# Patient Record
Sex: Female | Born: 1980 | Hispanic: No | Marital: Married | Smoking: Never smoker
Health system: Southern US, Community
[De-identification: ages and names within clinical notes are randomized; demographics above are authoritative.]

## PROBLEM LIST (undated history)

## (undated) DIAGNOSIS — S8290XA Unspecified fracture of unspecified lower leg, initial encounter for closed fracture: Secondary | ICD-10-CM

## (undated) DIAGNOSIS — D689 Coagulation defect, unspecified: Secondary | ICD-10-CM

## (undated) DIAGNOSIS — M21612 Bunion of left foot: Secondary | ICD-10-CM

## (undated) DIAGNOSIS — G4700 Insomnia, unspecified: Secondary | ICD-10-CM

## (undated) DIAGNOSIS — J40 Bronchitis, not specified as acute or chronic: Secondary | ICD-10-CM

## (undated) DIAGNOSIS — M792 Neuralgia and neuritis, unspecified: Secondary | ICD-10-CM

## (undated) DIAGNOSIS — M21611 Bunion of right foot: Secondary | ICD-10-CM

## (undated) DIAGNOSIS — K602 Anal fissure, unspecified: Secondary | ICD-10-CM

## (undated) DIAGNOSIS — F419 Anxiety disorder, unspecified: Secondary | ICD-10-CM

## (undated) HISTORY — DX: Unspecified fracture of unspecified lower leg, initial encounter for closed fracture: S82.90XA

## (undated) HISTORY — PX: LEG SURGERY: SHX1003

## (undated) HISTORY — PX: WISDOM TOOTH EXTRACTION: SHX21

## (undated) HISTORY — DX: Neuralgia and neuritis, unspecified: M79.2

## (undated) HISTORY — DX: Coagulation defect, unspecified: D68.9

---

## 2006-03-17 ENCOUNTER — Other Ambulatory Visit: Admission: RE | Admit: 2006-03-17 | Discharge: 2006-03-17 | Payer: Self-pay | Admitting: Obstetrics and Gynecology

## 2007-03-19 ENCOUNTER — Other Ambulatory Visit: Admission: RE | Admit: 2007-03-19 | Discharge: 2007-03-19 | Payer: Self-pay | Admitting: Obstetrics and Gynecology

## 2008-03-28 ENCOUNTER — Other Ambulatory Visit: Admission: RE | Admit: 2008-03-28 | Discharge: 2008-03-28 | Payer: Self-pay | Admitting: Obstetrics and Gynecology

## 2008-08-15 ENCOUNTER — Encounter: Admission: RE | Admit: 2008-08-15 | Discharge: 2008-08-15 | Payer: Self-pay | Admitting: Family Medicine

## 2009-06-04 ENCOUNTER — Emergency Department (HOSPITAL_COMMUNITY): Admission: EM | Admit: 2009-06-04 | Discharge: 2009-06-04 | Payer: Self-pay | Admitting: Family Medicine

## 2009-12-17 ENCOUNTER — Other Ambulatory Visit: Admission: RE | Admit: 2009-12-17 | Discharge: 2009-12-17 | Payer: Self-pay | Admitting: Obstetrics and Gynecology

## 2011-12-26 ENCOUNTER — Other Ambulatory Visit (HOSPITAL_COMMUNITY)
Admission: RE | Admit: 2011-12-26 | Discharge: 2011-12-26 | Disposition: A | Discharge status: Home / Self Care | Payer: BC Managed Care – PPO | Source: Ambulatory Visit | Attending: Obstetrics and Gynecology | Admitting: Obstetrics and Gynecology

## 2011-12-26 DIAGNOSIS — Z01419 Encounter for gynecological examination (general) (routine) without abnormal findings: Secondary | ICD-10-CM | POA: Insufficient documentation

## 2012-08-27 ENCOUNTER — Encounter (INDEPENDENT_AMBULATORY_CARE_PROVIDER_SITE_OTHER): Payer: Self-pay | Admitting: Surgery

## 2012-08-27 ENCOUNTER — Ambulatory Visit (INDEPENDENT_AMBULATORY_CARE_PROVIDER_SITE_OTHER): Payer: BC Managed Care – PPO | Admitting: Surgery

## 2012-08-27 VITALS — BP 150/84 | HR 76 | Resp 14 | Ht 67.0 in | Wt 244.8 lb

## 2012-08-27 DIAGNOSIS — K649 Unspecified hemorrhoids: Secondary | ICD-10-CM | POA: Insufficient documentation

## 2012-08-27 NOTE — Progress Notes (Addendum)
Re:   Samantha [name] DOB:   [birth year] MRN:   [id number]  ASSESSMENT AND PLAN: 1.  Anterior anal fissure  I gave her literature on fissures and rectal disease.  I discussed medical and surgical care of the fissure. I think that she will do well with medical care.  I discussed the internal sphincterotomy.  I also told her pregnancy could make her bowels and rectum worse.  I gave her a prescription for Anusol HC suppositories (#28, 2 refills) and Diltiazem cream (2 refills).  Her return appointment is PRN.  2.  Two external hemorrhoids  These are no causing her symptoms. 3.  Anxiety.  Chief Complaint  Patient presents with  . New Evaluation    hems   REFERRING PHYSICIAN: Bradd Burner, PA-C  HISTORY OF PRESENT ILLNESS: Samantha [name] is a 32 y.o. (DOB: 12-13-1980)  white  female whose primary care physician is Bradd Burner, PA-C and comes to me today for rectal pain that she thinks is secondary to hemorrhoids.  The patient gives a fairly classic story for a rectal fissure.  She has been having rectal pain with bowel movements for about 2 years on and off.  She does not remember a precipitating event.  She said it was much worse about 2 weeks ago when she made the appointment.  It seems to get better and then get worse.   She has used sitz baths, which helps.  She tried steroid suppositories, which helped.  She has no personal history of colon or rectal problems.  She has no family history of colon cancer, Crohn's disease, or colitis.  She has a soft bowel movement every other day  She does take stool bulking agents.  And she does notice some blood on her toilet paper, but not in her stool.   Past Medical History  Diagnosis Date  . Leg fracture      No past surgical history on file.    Current Outpatient Prescriptions  Medication Sig Dispense Refill  . calcium-vitamin D (OSCAL WITH D) 500-200 MG-UNIT per tablet Take 1 tablet by mouth.        . folic acid (FOLVITE) 1 MG tablet Take 1 mg by mouth daily.      Marland Kitchen LEXAPRO 20 MG tablet       . Multiple Vitamins-Minerals (MULTIVITAMIN WITH MINERALS) tablet Take 1 tablet by mouth daily.      . polycarbophil (FIBERCON) 625 MG tablet Take 625 mg by mouth daily.      Marland Kitchen zolpidem (AMBIEN) 10 MG tablet        No current facility-administered medications for this visit.     No Known Allergies  REVIEW OF SYSTEMS: Skin:  No history of rash.  No history of abnormal moles. Infection:  No history of hepatitis or HIV.  No history of MRSA. Neurologic:  No history of stroke.  No history of seizure.  No history of headaches. Cardiac:  No history of hypertension. No history of heart disease.  No history of prior cardiac catheterization. Pulmonary:  Does not smoke cigarettes.  No asthma or bronchitis.  No OSA/CPAP.  Endocrine:  No diabetes. No thyroid disease. Gastrointestinal:  No history of stomach disease.  No history of liver disease.  No history of gall bladder disease.  No history of pancreas disease.  No history of colon disease. Urologic:  No history of kidney stones.  No history of bladder infections. GYN:  Thinking about getting pregnant. Musculoskeletal:  No history of joint or back disease. Hematologic:  No bleeding disorder.  No history of anemia.  Not anticoagulated. Psycho-social:  The patient is oriented.   The patient has no obvious psychologic or social impairment to understanding our conversation and plan.  History of anxiety.  SOCIAL and FAMILY HISTORY: Married.  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  Works on [employer]. No children  PHYSICAL EXAM: BP 150/84  Pulse 76  Resp 14  Ht 5\' 7"  (1.702 m)  Wt 244 lb 12.8 oz (111.041 kg)  BMI 38.33 kg/m2  General: Obese WN WF who is alert and generally healthy appearing.  HEENT: Normal. Pupils equal. Neck: Supple. No mass.  No thyroid mass. Lymph Nodes:  No supraclavicular or cervical nodes. Lungs: Clear to auscultation and  symmetric breath sounds. Heart:  RRR. No murmur or rub. Abdomen: Soft. No mass. No tenderness. No hernia. Normal bowel sounds.  No abdominal scars. Rectal: Has left posterior and anterior hemorrhoidal tag.  These are not inflamed or thrombosed.  She does have an anterior rectal fissure with a sentinel tag.  But she tolerated rectal exam well and exam with the lighter anoscope. Extremities:  Good strength and ROM  in upper and lower extremities. Neurologic:  Grossly intact to motor and sensory function. Psychiatric: Has normal mood and affect. Behavior is normal.   DATA REVIEWED: No info.  Ovidio Kin, MD,  [facility] [address] [place]  [address]   [place], [place]    [address] Phone:  [phone] FAX:  [phone]

## 2012-12-27 ENCOUNTER — Other Ambulatory Visit: Payer: Self-pay | Admitting: Obstetrics and Gynecology

## 2012-12-27 ENCOUNTER — Other Ambulatory Visit (HOSPITAL_COMMUNITY)
Admission: RE | Admit: 2012-12-27 | Discharge: 2012-12-27 | Disposition: A | Discharge status: Home / Self Care | Payer: BC Managed Care – PPO | Source: Ambulatory Visit | Attending: Obstetrics and Gynecology | Admitting: Obstetrics and Gynecology

## 2012-12-27 DIAGNOSIS — Z01419 Encounter for gynecological examination (general) (routine) without abnormal findings: Secondary | ICD-10-CM | POA: Insufficient documentation

## 2012-12-27 DIAGNOSIS — Z1151 Encounter for screening for human papillomavirus (HPV): Secondary | ICD-10-CM | POA: Insufficient documentation

## 2014-01-16 ENCOUNTER — Other Ambulatory Visit: Payer: Self-pay | Admitting: Obstetrics and Gynecology

## 2014-01-16 ENCOUNTER — Other Ambulatory Visit (HOSPITAL_COMMUNITY)
Admission: RE | Admit: 2014-01-16 | Discharge: 2014-01-16 | Disposition: A | Discharge status: Home / Self Care | Payer: BC Managed Care – PPO | Source: Ambulatory Visit | Attending: Obstetrics and Gynecology | Admitting: Obstetrics and Gynecology

## 2014-01-16 DIAGNOSIS — Z01419 Encounter for gynecological examination (general) (routine) without abnormal findings: Secondary | ICD-10-CM | POA: Insufficient documentation

## 2014-01-17 LAB — CYTOLOGY - PAP

## 2015-01-16 ENCOUNTER — Other Ambulatory Visit: Payer: Self-pay | Admitting: Obstetrics and Gynecology

## 2015-01-16 ENCOUNTER — Other Ambulatory Visit (HOSPITAL_COMMUNITY)
Admission: RE | Admit: 2015-01-16 | Discharge: 2015-01-16 | Disposition: A | Discharge status: Home / Self Care | Payer: Commercial Managed Care - HMO | Source: Ambulatory Visit | Attending: Obstetrics and Gynecology | Admitting: Obstetrics and Gynecology

## 2015-01-16 DIAGNOSIS — Z01419 Encounter for gynecological examination (general) (routine) without abnormal findings: Secondary | ICD-10-CM | POA: Insufficient documentation

## 2015-01-17 LAB — CYTOLOGY - PAP

## 2015-10-15 ENCOUNTER — Encounter: Payer: Self-pay | Admitting: Family Medicine

## 2015-10-15 ENCOUNTER — Ambulatory Visit (INDEPENDENT_AMBULATORY_CARE_PROVIDER_SITE_OTHER): Payer: Commercial Managed Care - HMO | Admitting: Family Medicine

## 2015-10-15 DIAGNOSIS — F5081 Binge eating disorder: Secondary | ICD-10-CM | POA: Diagnosis not present

## 2015-10-15 NOTE — Patient Instructions (Addendum)
Sleep: - Consider seeing acupuncturist [name]) for your sleep problems.   - Read the sleep handout provided today.    Weight Management: - You will be better off with 1600 kcal per day than the meager 1200 kcal, especially when you work out.   - Eat at least 3 REAL meals and 1-2 snacks per day.  Aim for no more than 5 hours between eating.  Eat breakfast within one hour of getting up.   A real meal includes at least a protein, a starch, and veg's and/or fruit.    OR: Would you serve this to a guest in your home, and call it a meal? - Obtain twice as many veg's as protein or carbohydrate foods for both lunch and dinner.  Dealing with non-hunger eating:  - Delay:  Neither urges nor emotions last forever - Distract:  Engaging in another activity can truly get your mind off of food  - Distance:  Move to another room (away from the food), go for a walk, or change your environment AND: - Determine:  What is happening here, and what are my options?  - When you are struggling, ask yourself these three "decoding" questions:  1. What am I feeling right now?  2. What do I want to feel?  3. What do I truly need right now? Use the Feelings and Needs lists, and WRITE your answers.  After each question, ask yourself, "Is there anything more?" You are looking for feelings, not thoughts.  Bring your responses to your follow-up appt.   - Decide:  What will be your action plan? - Be Deliberate in using the tools you learn!  - Pay attention to your hunger.  If you feel hungry, DO get something to eat!  Consider Weigh to Wellness II, starting Oct 10; meets 6 Tuesdays from 5:30-6:30 PM at [facility], [address]   Weigh to [employer] Online Registration, 2017:  1. Registration can be accessed at the following:   [email or web address]  2. Click on Weight Loss.   3. At the next screen, click on Weigh to Wellness I: The Fundamentals or for Weigh to Wellness II:  Beyond Diets.    4. Now click on REGISTER ONLINE, and follow the instructions for the information and payment requested.  Be sure to indicate if you are a [facility] employee (and provide employee ID if you are).    5. You'll need to click on ADD TO CART followed by PLACE ORDER.

## 2015-10-15 NOTE — Progress Notes (Signed)
Medical Nutrition Therapy:  Appt start time: 1000 end time:  1100. PCP: Elias Elseobert Reade, MD; [facility] (Fax visit notes to Dr. Nicholos Johnseade - [phone])  Ms. Carmelina DaneKillian was referred by Dr. Nicholos Johnseade to management of her weight (E66.01) and binge eating d/o (F50.81) Assessment:  Primary concerns today: Weight management and binge eating disorder (F50.81).   Marchelle Folksmanda really wants to get to a normal, healthy weight.  She is following [employer] weight loss program offered through her work.  In the program, she has specific goals, and an app that gives her feedback, including recent advice to see an RD b/c her kcal intake is so low.  She has been losing 1-2 lb per week.    Marchelle Folksmanda has realized with the program she's following that she often eats emotionally and with boredom.    Learning Readiness: Change in progress; has been on a 1200-1600-kcal diet for ~3 months, with one day/wk of higher kcal, up to 2200 kcal.    Usual eating pattern includes 3 meals and 1 snack per day. Frequent foods and beverages include [place] yogurt, almonds, Grapenuts, coffee with sk milk, apples, bananas, str'berries, grapes, Malaw[place]urkey, chx, salmon, soy burgers, garbanzos, small wraps, avocados, carrots, cucumbers, spinach, goat chs, hummus, frozen burritos.  Avoided foods include most other fruits (dislikes tartness).   Usual physical activity includes 1 hr str training & cardio with a personal trainer; 1 hr weights & cardio 1 X wk water ex 1-2 X wk.  24-hr recall: (Up at 9:30 AM) B (11 AM)-   2 slc avocado toast, 2 eggs, coffee, 2 oz sk milk Snk (2 PM)-   1 apple, 2 T pb L (4 PM)-  1 large salad, bacon bits, sesame sds, 1 T lite ranch, water Snk ( PM)-  --- D (7:30 PM)-  [place] beef & veg's, 1/2 c brn rice, water Snk ( PM)-  --- Typical day? Yes.  for a weekend, although seldom eats out.    Progress Towards Goal(s):  In progress.   Nutritional Diagnosis:  [place]-3.3 Overweight/obesity As related to energy imbalance.  As  evidenced by BMI >38.    Intervention:  Nutrition education.  Handouts given during visit include:  AVS  Sleep hygiene handout  Decoding Feelings and Needs lists  Demonstrated degree of understanding via:  Teach Back  Barriers to learning/adherence to lifestyle change: Unlikely sustainability of 1200-kcal diet.    Monitoring/Evaluation:  Dietary intake, exercise, and body weight in 8 week(s).

## 2015-12-06 ENCOUNTER — Encounter: Payer: Self-pay | Admitting: Family Medicine

## 2015-12-06 ENCOUNTER — Ambulatory Visit (INDEPENDENT_AMBULATORY_CARE_PROVIDER_SITE_OTHER): Payer: Commercial Managed Care - HMO | Admitting: Family Medicine

## 2015-12-06 DIAGNOSIS — F5081 Binge eating disorder: Secondary | ICD-10-CM

## 2015-12-06 NOTE — Patient Instructions (Addendum)
-   Keep up the great choices! - Your [employer] program recommendations for 1400 kcal: 42 g fat, 74 g protein, 182 g carb.  These are roughly equivalent to 30% fat, 20% protein, and 50% carb.  This is very reasonable, and should help you continue to reach your goals.    - Talk with Dr. Nicholos Johnseade about increasing your exercise.  (My recommendation:  Hold off on increasing until below 200 lb.)  - Keep accessible your materials on emotional eating; use as needed.    - A tentative start date for the Weigh to Wellness II class is Monday, Feb 26.  We [facility] 5:30-6:30 [facility].  I will confirm class dates with you at your appt in Dec.

## 2015-12-06 NOTE — Progress Notes (Signed)
Medical Nutrition Therapy:  Appt start time: 1000 end time:  1100. PCP: Elias Elseobert Reade, MD; [facility] (Fax visit notes to Dr. Nicholos Johnseade - [phone])  Ms. Samantha [name] was referred by Dr. Nicholos Johnseade to management of her weight (E66.01) and binge eating d/o (F50.81) Assessment:  Primary concerns today: Weight management and binge eating disorder (F50.81).   Samantha [name]'s weight is down 10 lb since last appt on 10/15/15, and a total of 48 lb since first starting.  For fun last night, she brought a knapsack filled with 50 lb to the gym, where she walked a mile with it.  After taking it off, she went another mile, which felt great.  She has noticed many benefits, like fitting into chairs well, not getting winded walking up stairs.  She has found some new recipes she likes, such as baked (no frying) eggplant parmigiana. Samantha [name] is still doing an eat-what-you-want day each week, but has found that she is making increasingly better choices on those days and enjoying it.    Physical activity includes personal trainer 1 X wk (mostly strength training), body pump 1 X wk, 60 min walking 1 X wk, and occasionally another workout per week.  She is also making an effort to use the stairs vs. elevator, etc.    24-hr recall:  (Up at 7:30 AM) B (7:40 AM)-  1/2 c Grapenuts, 1/2 c skim milk, water Snk (9 AM)-  20 oz coffee, 1/4 c sk milk L (12 PM)-  1 bean burrito, water Snk ( PM)-  water D (5:30 PM)-  1 c Chinese mandarin seitan, 1 c brn rice, 1/2 c broccoli, water Snk ( PM)-  --- Typical day? Yes.  but did not have usual veg's at lunch or dinner, and skipped afternoon snack b/c she was too busy.    Progress Towards Goal(s):  In progress.   Nutritional Diagnosis:  [place]-3.3 Overweight/obesity As related to energy imbalance.  As evidenced by BMI >38.    Intervention:  Nutrition education.  Handouts given during visit include:  AVS  Demonstrated degree of understanding via:  Teach Back  Barriers to  learning/adherence to lifestyle change: No signficant barriers foreseen right now; pt is doing great, and is aware that stress or other life events can sometimes be challenging.  Seems to have gained a lot of insight into how to handle difficulties around food and exercise behaviors.    Monitoring/Evaluation:  Dietary intake, exercise, and body weight in 8 week(s).

## 2016-01-01 ENCOUNTER — Ambulatory Visit: Payer: Self-pay | Admitting: Surgery

## 2016-01-01 NOTE — H&P (Signed)
Samantha [name] 01/01/2016 11:26 AM Location: [facility] Patient #: [id number] DOB: July 23, 1980 Married / Language: [place] / Race: White Female   History of Present Illness Samantha [name]([name] C. [name] MD; 01/01/2016 12:20 PM) The patient is a 35 year old female who presents with anal pain. Note for "Anal pain": Patient here for surgical consultation at the request of Dr. Jimmy FootmanBecker [employer] positions.. Has been struggling with anal pain for several years.  Pleasant woman. Developed sharp rectal pain a few years ago. Was seen by Dr. Ezzard StandingNewman in our group. Diagnosed with an anterior anal fissure, though she couldn't tolerate digital and anoscopic exam.. Recommendation made for suppositories as well as diltiazem cream. Follow-up if not improved. I think she was thrown off by the idea of possibly needing a sphincterotomy if that failed. She's been trying to deal with it on her own for the past few years. Discussing with her primary care physician. She's been taking a fiber supplement. Sounds like a FiberCon tablet once a day. Moves her bowels about every other day.   She notes that the rectal bleeding and pain has never fully gone away. Intermittently flares. Became more intense this summer. She feels like she has 2 spots that bother her now. She had 2 skin tags 4 years ago by Dr. Allene PyoNewman's examination.. It still hurts to have bowel movements. Often gets rectal bleeding with wiping. Occasionally using Colace and steroid suppositories without relief. She is never had a colonoscopy.  No personal nor family history of GI/colon cancer, inflammatory bowel disease, irritable bowel syndrome, allergy such as Celiac Sprue, dietary/dairy problems, colitis, ulcers nor gastritis. No recent sick contacts/gastroenteritis. No travel outside the country. No changes in diet. No dysphagia to solids or liquids. No significant heartburn or reflux. No hematochezia, hematemesis, coffee ground emesis. No  evidence of prior gastric/peptic ulceration.   Other Problems Samantha [name]([name], [place]; 01/01/2016 11:26 AM) Anxiety Disorder Hemorrhoids  Diagnostic Studies History Samantha [name]([name], [place]; 01/01/2016 11:26 AM) Colonoscopy never Mammogram never Pap Smear 1-5 years ago  Allergies Samantha [name]([name], CMA; 01/01/2016 11:27 AM) No Known Drug Allergies11/14/2017  Medication History Samantha [name]([name], CMA; 01/01/2016 11:29 AM) Lexapro (20MG  Tablet, Oral daily) Active. TraZODone HCl (50MG  Tablet, 25mg  Oral daily) Active. Melatonin (10MG  Tablet, Oral daily) Active. Multivitamins/Minerals (Oral daily) Active. FiberCon (625MG  Tablet, Oral daily) Active. Medications Reconciled  Social History Samantha [name]([name], [place]; 01/01/2016 11:26 AM) Alcohol use Occasional alcohol use. Caffeine use Coffee. Illicit drug use Uses socially only. Tobacco use Never smoker.  Family History Samantha [name]([name], [place]; 01/01/2016 11:26 AM) Alcohol Abuse Brother, Sister. Anesthetic complications Mother. Arthritis Father. Depression Mother. Diabetes Mellitus Father. Heart Disease Father. Heart disease in female family member before age 36 Hypertension Father. Thyroid problems Mother.  Pregnancy / Birth History Samantha [name]([name], [place]; 01/01/2016 11:26 AM) Age at menarche 11 years. Contraceptive History Oral contraceptives. Gravida 0 Para 0 Regular periods    Review of Systems Samantha [name]([name] CMA; 01/01/2016 11:26 AM) General Present- Weight Loss. Not Present- Appetite Loss, Chills, Fatigue, Fever, Night Sweats and Weight Gain. Skin Not Present- Change in Wart/Mole, Dryness, Hives, Jaundice, New Lesions, Non-Healing Wounds, Rash and Ulcer. HEENT Present- Wears glasses/contact lenses. Not Present- Earache, Hearing Loss, Hoarseness, Nose Bleed, Oral Ulcers, Ringing in the Ears, Seasonal Allergies, Sinus Pain, Sore Throat, Visual Disturbances and Yellow Eyes. Respiratory Not Present- Bloody sputum,  Chronic Cough, Difficulty Breathing, Snoring and Wheezing. Breast Not Present- Breast Mass, Breast Pain, Nipple Discharge and Skin Changes. Cardiovascular Not Present- Chest Pain, Difficulty Breathing Lying  Down, Leg Cramps, Palpitations, Rapid Heart Rate, Shortness of Breath and Swelling of Extremities. Gastrointestinal Not Present- Abdominal Pain, Bloating, Bloody Stool, Change in Bowel Habits, Chronic diarrhea, Constipation, Difficulty Swallowing, Excessive gas, Gets full quickly at meals, Hemorrhoids, Indigestion, Nausea, Rectal Pain and Vomiting. Female Genitourinary Not Present- Frequency, Nocturia, Painful Urination, Pelvic Pain and Urgency. Musculoskeletal Not Present- Back Pain, Joint Pain, Joint Stiffness, Muscle Pain, Muscle Weakness and Swelling of Extremities. Neurological Present- Headaches. Not Present- Decreased Memory, Fainting, Numbness, Seizures, Tingling, Tremor, Trouble walking and Weakness. Psychiatric Present- Anxiety. Not Present- Bipolar, Change in Sleep Pattern, Depression, Fearful and Frequent crying. Endocrine Not Present- Cold Intolerance, Excessive Hunger, Hair Changes, Heat Intolerance, Hot flashes and New Diabetes. Hematology Not Present- Blood Thinners, Easy Bruising, Excessive bleeding, Gland problems, HIV and Persistent Infections.  Vitals [name] CMA; 01/01/2016 11:30 AM) 01/01/2016 11:29 AM Weight: 245.8 lb Height: 67in Body Surface Area: 2.21 m Body Mass Index: 38.5 kg/m  Temp.: 98.38F  Pulse: 74 (Regular)  BP: 126/82 (Sitting, Left Arm, Standard)       Physical Exam Samantha Sportsman MD; 01/01/2016 12:22 PM) General Mental Status-Alert. General Appearance-Not in acute distress, Not Sickly. Orientation-Oriented X3. Hydration-Well hydrated. Voice-Normal.  Integumentary Global Assessment Upon inspection and palpation of skin surfaces of the - Axillae: non-tender, no inflammation or ulceration, no drainage. and  Distribution of scalp and body hair is normal. General Characteristics Temperature - normal warmth is noted.  Head and Neck Head-normocephalic, atraumatic with no lesions or palpable masses. Face Global Assessment - atraumatic, no absence of expression. Neck Global Assessment - no abnormal movements, no bruit auscultated on the right, no bruit auscultated on the left, no decreased range of motion, non-tender. Trachea-midline. Thyroid Gland Characteristics - non-tender.  Eye Eyeball - Left-Extraocular movements intact, No Nystagmus. Eyeball - Right-Extraocular movements intact, No Nystagmus. Cornea - Left-No Hazy. Cornea - Right-No Hazy. Sclera/Conjunctiva - Left-No scleral icterus, No Discharge. Sclera/Conjunctiva - Right-No scleral icterus, No Discharge. Pupil - Left-Direct reaction to light normal. Pupil - Right-Direct reaction to light normal.  ENMT Ears Pinna - Left - no drainage observed, no generalized tenderness observed. Right - no drainage observed, no generalized tenderness observed. Nose and Sinuses External Inspection of the Nose - no destructive lesion observed. Inspection of the nares - Left - quiet respiration. Right - quiet respiration. Mouth and Throat Lips - Upper Lip - no fissures observed, no pallor noted. Lower Lip - no fissures observed, no pallor noted. Nasopharynx - no discharge present. Oral Cavity/Oropharynx - Tongue - no dryness observed. Oral Mucosa - no cyanosis observed. Hypopharynx - no evidence of airway distress observed.  Chest and Lung Exam Inspection Movements - Normal and Symmetrical. Accessory muscles - No use of accessory muscles in breathing. Palpation Palpation of the chest reveals - Non-tender. Auscultation Breath sounds - Normal and Clear.  Cardiovascular Auscultation Rhythm - Regular. Murmurs & Other Heart Sounds - Auscultation of the heart reveals - No Murmurs and No Systolic  Clicks.  Abdomen Inspection Inspection of the abdomen reveals - No Visible peristalsis and No Abnormal pulsations. Umbilicus - No Bleeding, No Urine drainage. Palpation/Percussion Palpation and Percussion of the abdomen reveal - Soft, Non Tender, No Rebound tenderness, No Rigidity (guarding) and No Cutaneous hyperesthesia. Note: Abdomen soft. Nontender, nondistended. No guarding. No umbilical no other hernias   Female Genitourinary Sexual Maturity Tanner 5 - Adult hair pattern. Note: No vaginal bleeding nor discharge. No inguinal lymphadenopathy. No inguinal hernias.   Rectal Note: Perianal tags. Anterior midline with  some irritation in anal canal. Posterior midline larger and seems more consistent with prolapsing internal hemorrhoid. Some thin scarring they're suspicious for old fissure. No active fissure now.   Exam done with assistance of female Medical Assistant in the room. Perianal skin clean with good hygiene. Mild irritation but no severe pruritis ani. No pilonidal disease. No fissure. No abscess/fistula. Normal sphincter tone. Tolerates digital and anoscopic rectal exam. No rectal masses.   Peripheral Vascular Upper Extremity Inspection - Left - No Cyanotic nailbeds, Not Ischemic. Right - No Cyanotic nailbeds, Not Ischemic.  Neurologic Neurologic evaluation reveals -normal attention span and ability to concentrate, able to name objects and repeat phrases. Appropriate fund of knowledge , normal sensation and normal coordination. Mental Status Affect - not angry, not paranoid. Cranial Nerves-Normal Bilaterally. Gait-Normal.  Neuropsychiatric Mental status exam performed with findings of-able to articulate well with normal speech/language, rate, volume and coherence, thought content normal with ability to perform basic computations and apply abstract reasoning and no evidence of hallucinations, delusions, obsessions or homicidal/suicidal  ideation.  Musculoskeletal Global Assessment Spine, Ribs and Pelvis - no instability, subluxation or laxity. Right Upper Extremity - no instability, subluxation or laxity.  Lymphatic Head & Neck  General Head & Neck Lymphatics: Bilateral - Description - No Localized lymphadenopathy. Axillary  General Axillary Region: Bilateral - Description - No Localized lymphadenopathy. Femoral & Inguinal  Generalized Femoral & Inguinal Lymphatics: Left - Description - No Localized lymphadenopathy. Right - Description - No Localized lymphadenopathy.   Results Samantha Sportsman MD; 01/01/2016 12:23 PM) Procedures  Name Value Date Hemorrhoids Procedure Anal exam: External Hemorrhoid prolapse Internal exam: Internal Hemorroids ( non-bleeding) prolapse Other: Perianal tags. Anterior midline with some irritation in anal canal. Posterior midline larger and seems more consistent with prolapsing internal hemorrhoid. Some thin scarring they're suspicious for old fissure. No active fissure now. Exam done with assistance of female Medical Assistant in the room. Perianal skin clean with good hygiene. Mild irritation but no severe pruritis ani. No pilonidal disease. No fissure. No abscess/fistula. Normal sphincter tone. Tolerates digital and anoscopic rectal exam. No rectal masses.  Performed: 01/01/2016 12:22 PM    Assessment & Plan Samantha Sportsman MD; 01/01/2016 12:22 PM) EXTERNAL HEMORRHOIDS WITH COMPLICATION (K64.4) Impression: This point she does not have an active fissure to my exam visually digitally and endoscopically.  Severe anal fissure that would not have formal pain to digital anoscopic exam. I think he is is more consistent with symptomatic hemorrhoids.  The anatomy & physiology of the anorectal region was discussed. The pathophysiology of hemorrhoids and differential diagnosis was discussed. Natural history progression was discussed. I stressed the importance of  a bowel regimen to have daily soft bowel movements to minimize progression of disease. I would recommend strongly that she increase the fiber in diet until she's having 1 or 2 soft bowel today to prevent further flares. Goal of one BM / day ideal. Use of wet wipes, warm baths, avoiding straining, etc were emphasized.  Educational handouts further explaining the pathology, treatment options, and bowel regimen were given as well. The patient expressed understanding.  She does stop some persistent anal tags. The right posterior one somewhat large and intermittently prolapsing. The anterior one with some irritation and flaring. I suspect that is most of her complaints and discomfort, especially since they're some irratation internally.  I see no fissure to treat some skeptical topical agents will help. They did not in the past.  Recommended examination under anesthesia with removal of  the external tags and possible internal hemorrhoid ligation. Allow me to do a more thorough examination with her asleep to make sure I'm not missing anything else.  Another consideration is considering gastroenterology consultation. In the absence of severe bleeding, irregular bowel habits, lack of a normal examination; would not do this first. Consider during surgery and if persistent problem, have gastroenterology evaluate.  She seemed open to the idea of surgery but had been skeptical and the past. We'll give her time to think about things. She will let us know.   Current Plans Pt Education - [facility] Free Text Education/Instructions: discussed with patient and provided information. PROLAPSED INTERNAL HEMORRHOIDS, GRADE 2 (K64.1) Impression: Not horrifically inflamed but would benefit from hemorrhoidal ligation while asleep. Current Plans ANOSCOPY, DIAGNOSTIC ([id number]) ENCOUNTER FOR PREOPERATIVE EXAMINATION FOR GENERAL SURGICAL PROCEDURE (Z01.818) Current Plans You are being scheduled for surgery - Our schedulers will  call you.  You should hear from our office's scheduling department within 5 working days about the location, date, and time of surgery. We try to make accommodations for patient's preferences in scheduling surgery, but sometimes the OR schedule or the surgeon's schedule prevents us from making those accommodations.  If you have not heard from our office [phone]) in 5 working days, call the office and ask for your surgeon's nurse.  If you have other questions about your diagnosis, plan, or surgery, call the office and ask for your surgeon's nurse.  Pt Education - [facility] Rectal Prep for Anorectal outpatient/office surgery: discussed with patient and provided information. Pt Education - [facility] Rectal Surgery HCI (Olufemi Mofield): discussed with patient and provided information. Pt Education - [facility] Hemorrhoids (Liisa Picone): discussed with patient and provided information. Pt Education - [facility] Pelvic Floor Exercises (Kegels) and Dysfunction HCI (Rhandi Despain)   Signed by Samantha SportsmanSteven C Rielly Brunn, MD (01/01/2016 12:23 PM)

## 2016-01-17 ENCOUNTER — Other Ambulatory Visit: Payer: Self-pay | Admitting: Obstetrics and Gynecology

## 2016-01-17 ENCOUNTER — Other Ambulatory Visit (HOSPITAL_COMMUNITY)
Admission: RE | Admit: 2016-01-17 | Discharge: 2016-01-17 | Disposition: A | Discharge status: Home / Self Care | Payer: Commercial Managed Care - HMO | Source: Ambulatory Visit | Attending: Obstetrics and Gynecology | Admitting: Obstetrics and Gynecology

## 2016-01-17 DIAGNOSIS — Z1151 Encounter for screening for human papillomavirus (HPV): Secondary | ICD-10-CM | POA: Insufficient documentation

## 2016-01-17 DIAGNOSIS — Z01419 Encounter for gynecological examination (general) (routine) without abnormal findings: Secondary | ICD-10-CM | POA: Insufficient documentation

## 2016-01-21 LAB — CYTOLOGY - PAP
Diagnosis: NEGATIVE
HPV (WINDOPATH): NOT DETECTED

## 2016-01-31 ENCOUNTER — Ambulatory Visit: Payer: Commercial Managed Care - HMO | Admitting: Family Medicine

## 2016-02-28 ENCOUNTER — Ambulatory Visit: Payer: Commercial Managed Care - HMO | Admitting: Family Medicine

## 2016-03-26 DIAGNOSIS — K5909 Other constipation: Secondary | ICD-10-CM | POA: Diagnosis not present

## 2016-03-26 DIAGNOSIS — B351 Tinea unguium: Secondary | ICD-10-CM | POA: Diagnosis not present

## 2016-06-20 DIAGNOSIS — K625 Hemorrhage of anus and rectum: Secondary | ICD-10-CM | POA: Diagnosis not present

## 2016-06-20 DIAGNOSIS — G4700 Insomnia, unspecified: Secondary | ICD-10-CM | POA: Diagnosis not present

## 2016-06-27 ENCOUNTER — Encounter: Payer: Self-pay | Admitting: Gastroenterology

## 2016-07-08 ENCOUNTER — Encounter (INDEPENDENT_AMBULATORY_CARE_PROVIDER_SITE_OTHER): Payer: Self-pay

## 2016-07-08 ENCOUNTER — Encounter: Payer: Self-pay | Admitting: Gastroenterology

## 2016-07-08 ENCOUNTER — Ambulatory Visit (INDEPENDENT_AMBULATORY_CARE_PROVIDER_SITE_OTHER): Payer: Commercial Managed Care - HMO | Admitting: Gastroenterology

## 2016-07-08 VITALS — BP 110/76 | HR 72 | Ht 67.0 in | Wt 237.0 lb

## 2016-07-08 DIAGNOSIS — K602 Anal fissure, unspecified: Secondary | ICD-10-CM | POA: Diagnosis not present

## 2016-07-08 DIAGNOSIS — K625 Hemorrhage of anus and rectum: Secondary | ICD-10-CM | POA: Diagnosis not present

## 2016-07-08 MED ORDER — AMBULATORY NON FORMULARY MEDICATION: 1 refills | Status: DC

## 2016-07-08 NOTE — Patient Instructions (Signed)
We have sent the following medications to your pharmacy for you to pick up at your convenience: 1. Nitroglycerin 0.125 mg   Call back in 4-6 weeks with an update, ask for Patty.

## 2016-07-08 NOTE — Progress Notes (Signed)
07/08/2016 Andy Gauss Ronette Hank [id number] 07/25/1980   HISTORY OF PRESENT ILLNESS:  This is a pleasant 36 year old female who is new to our office. She is referred here by Horton Marshall, PA-C, for evaluation regarding rectal bleeding and discomfort. She tells me that about 3 years ago she had some rectal bleeding. She went to see a Careers adviser and they told her she had an anal fissure. She was treated for that although she does not recall exactly what medicine it was. Says that it healed and she was fine, but then in August 2017 she became a little constipated and then started having bleeding with her bowel movements described as bright red blood and rectal/anal pain. She says that in November she went to see another surgeon again and says that she did not have a good experience with that surgeon. She says that we was extremely rough when doing an exam and she did not care for his bedside manner. She says that he told her at that time she did not have an anal fissure. She says that she is not really get constipated, maybe every once in a while but not on a regular basis. Most recently she has been using some lidocaine gel on the area along with some Vaseline that was recommended by her PCP.  She says that the pain can rate anywhere from a discomfort to "take your breath away" type of pain. Mostly hurts when having a bowel movement, but sometimes bothers her when just sitting, etc.   Past Medical History:  Diagnosis Date  . Leg fracture    Past Surgical History:  Procedure Laterality Date  . LEG SURGERY    . WISDOM TOOTH EXTRACTION      reports that she has never smoked. She has never used smokeless tobacco. She reports that she drinks alcohol. She reports that she does not use drugs. family history includes Cancer in her paternal grandfather; Diabetes in her father; Thyroid disease in her mother. No Known Allergies    Outpatient Encounter Prescriptions as of 07/08/2016  Medication Sig  .  DiphenhydrAMINE HCl (BENADRYL ALLERGY PO) Take by mouth.  Marland Kitchen LEXAPRO 20 MG tablet   . lidocaine (XYLOCAINE) 5 % ointment Apply 1 application topically as needed.  . Melatonin 10 MG TABS Take by mouth.  . Multiple Vitamins-Minerals (MULTIVITAMIN WITH MINERALS) tablet Take 1 tablet by mouth daily.  . Norgestimate-Ethinyl Estradiol Triphasic (ORTHO TRI-CYCLEN LO) 0.18/0.215/0.25 MG-25 MCG tab Take 1 tablet by mouth daily.  . polycarbophil (FIBERCON) 625 MG tablet Take 625 mg by mouth daily.  . TRAZODONE HCL PO Take 5 mg by mouth at bedtime.   No facility-administered encounter medications on file as of 07/08/2016.      REVIEW OF SYSTEMS  : All other systems reviewed and negative except where noted in the History of Present Illness.   PHYSICAL EXAM: BP 110/76   Pulse 72   Ht 5\' 7"  (1.702 m)   Wt 237 lb (107.5 kg)   LMP 06/23/2016 (Approximate)   BMI 37.12 kg/m  General: Well developed white female in no acute distress Head: Normocephalic and atraumatic Eyes:  Sclerae anicteric, conjunctiva pink. Ears: Normal auditory acuity Lungs: Clear throughout to auscultation Heart: Regular rate and rhythm Abdomen: Soft, non-distended. Normal bowel sounds.  Non-tender. Rectal:  A deep fissure was seen posteriorly that started bleeding when I touched the area.  Painful on exam as well, but did tolerate DRE, which did not reveal any masses.  Small  amount of soft brown stool on exam glove with frank blood from the bleeding area. Musculoskeletal: Symmetrical with no gross deformities  Skin: No lesions on visible extremities Extremities: No edema  Neurological: Alert oriented x 4, grossly non-focal Psychological:  Alert and cooperative. Normal mood and affect  ASSESSMENT AND PLAN: -Posterior anal fissure with pain and bleeding:  Will treat with nitroglycerin fel 0.125% 2-3 times daily for at least 8 weeks.  Advised to keep stools soft, gentle hygiene, etc.  May take several weeks to heal.  Will call  back in 4-5 weeks with update.   CC:  Wilfrid LundBecker, Anna G, [place]

## 2016-07-08 NOTE — Progress Notes (Signed)
I agree with the above note, plan 

## 2016-08-22 ENCOUNTER — Telehealth: Payer: Self-pay | Admitting: Gastroenterology

## 2016-08-22 NOTE — Telephone Encounter (Signed)
Pt is calling as requested with a condition update.  NO pain at all, about 1 time a week she will have occasional light bleeding.  Has 2 weeks of nitro ointment left.  She feels much better.  Does she need refill on nitro ointment?  Please advise.

## 2016-08-26 NOTE — Telephone Encounter (Signed)
I am glad that she is feeling better.  Please have her continue the nitro for a total of 10 weeks, so through the very end of July.  Monitor the bleeding until then and call us back at the beginning of August with another update.  Please also advise again to keep stools soft and avoid wiping aggressively, etc.  Thank you,  Jess

## 2016-08-26 NOTE — Telephone Encounter (Signed)
The pt has been advised and will call back in August to update

## 2016-08-26 NOTE — Telephone Encounter (Signed)
Left message on machine to call back  

## 2016-09-10 DIAGNOSIS — Z719 Counseling, unspecified: Secondary | ICD-10-CM | POA: Diagnosis not present

## 2016-09-16 DIAGNOSIS — Z719 Counseling, unspecified: Secondary | ICD-10-CM | POA: Diagnosis not present

## 2016-09-23 ENCOUNTER — Telehealth: Payer: Self-pay | Admitting: Gastroenterology

## 2016-09-23 DIAGNOSIS — Z719 Counseling, unspecified: Secondary | ICD-10-CM | POA: Diagnosis not present

## 2016-09-23 NOTE — Telephone Encounter (Signed)
Yes, we can refill, but I would like her to come back in for repeat exam in a couple of weeks.  If it continues to not heal completely then she may need to see surgery again.

## 2016-09-23 NOTE — Telephone Encounter (Signed)
Samantha BumpsJessica the pt continues to have rectal bleeding from fissure and wants to know if she can have a refill of her nitro ointment.  She does not have a follow up.   Last seen on 07/08/16.  It is better but not completley healed.  Her bowels are soft.  Please advise.

## 2016-09-23 NOTE — Telephone Encounter (Signed)
Left message on machine to call back  

## 2016-09-29 NOTE — Telephone Encounter (Signed)
Left message on machine to call back will wait for further communication from the pt  

## 2016-10-02 ENCOUNTER — Telehealth: Payer: Self-pay | Admitting: Gastroenterology

## 2016-10-02 MED ORDER — AMBULATORY NON FORMULARY MEDICATION: 1 refills | Status: DC

## 2016-10-02 NOTE — Telephone Encounter (Signed)
Pt advised of the refill for her nitro ointment.  She is aware to call and make follow up appt for continued pain

## 2016-10-30 ENCOUNTER — Other Ambulatory Visit: Payer: Self-pay | Admitting: Nurse Practitioner

## 2016-10-30 ENCOUNTER — Ambulatory Visit
Admission: RE | Admit: 2016-10-30 | Discharge: 2016-10-30 | Disposition: A | Discharge status: Home / Self Care | Payer: 59 | Source: Ambulatory Visit | Attending: Nurse Practitioner | Admitting: Nurse Practitioner

## 2016-10-30 DIAGNOSIS — S99911A Unspecified injury of right ankle, initial encounter: Secondary | ICD-10-CM | POA: Diagnosis not present

## 2016-10-30 DIAGNOSIS — M79672 Pain in left foot: Secondary | ICD-10-CM | POA: Diagnosis not present

## 2016-10-30 DIAGNOSIS — W19XXXA Unspecified fall, initial encounter: Secondary | ICD-10-CM

## 2016-10-30 DIAGNOSIS — S99922A Unspecified injury of left foot, initial encounter: Secondary | ICD-10-CM | POA: Diagnosis not present

## 2016-10-31 DIAGNOSIS — S82391A Other fracture of lower end of right tibia, initial encounter for closed fracture: Secondary | ICD-10-CM | POA: Diagnosis not present

## 2016-10-31 DIAGNOSIS — M79672 Pain in left foot: Secondary | ICD-10-CM | POA: Diagnosis not present

## 2016-11-15 DIAGNOSIS — I2699 Other pulmonary embolism without acute cor pulmonale: Secondary | ICD-10-CM | POA: Diagnosis not present

## 2016-11-15 DIAGNOSIS — X58XXXA Exposure to other specified factors, initial encounter: Secondary | ICD-10-CM | POA: Diagnosis present

## 2016-11-15 DIAGNOSIS — R079 Chest pain, unspecified: Secondary | ICD-10-CM | POA: Diagnosis not present

## 2016-11-15 DIAGNOSIS — Z79899 Other long term (current) drug therapy: Secondary | ICD-10-CM

## 2016-11-15 DIAGNOSIS — E871 Hypo-osmolality and hyponatremia: Secondary | ICD-10-CM | POA: Diagnosis present

## 2016-11-15 DIAGNOSIS — I248 Other forms of acute ischemic heart disease: Secondary | ICD-10-CM | POA: Diagnosis present

## 2016-11-15 DIAGNOSIS — S93401A Sprain of unspecified ligament of right ankle, initial encounter: Secondary | ICD-10-CM | POA: Diagnosis present

## 2016-11-15 DIAGNOSIS — D72829 Elevated white blood cell count, unspecified: Secondary | ICD-10-CM | POA: Diagnosis present

## 2016-11-15 DIAGNOSIS — R000 Tachycardia, unspecified: Secondary | ICD-10-CM | POA: Diagnosis not present

## 2016-11-15 DIAGNOSIS — W19XXXA Unspecified fall, initial encounter: Secondary | ICD-10-CM | POA: Diagnosis present

## 2016-11-15 DIAGNOSIS — Z793 Long term (current) use of hormonal contraceptives: Secondary | ICD-10-CM

## 2016-11-15 DIAGNOSIS — F419 Anxiety disorder, unspecified: Secondary | ICD-10-CM | POA: Diagnosis present

## 2016-11-15 NOTE — ED Triage Notes (Addendum)
Pt c/o L sided cp that radiates through to back, onset today after walking up a hill. Pain much worse with deep inspiration, pt states after activity she reports palpitations. Pt reports palpitations occur more frequently when she attempts to lay flat and pain worse with L recumbent position. Pt denies dizziness, denies n/v, denies diaphoresis. Pt does have boot on R ankle d/t recent fx that did not require surgery.

## 2016-11-16 ENCOUNTER — Inpatient Hospital Stay (HOSPITAL_COMMUNITY)
Admission: EM | Admit: 2016-11-16 | Discharge: 2016-11-18 | DRG: 176 | Disposition: A | Discharge status: Home / Self Care | Payer: 59 | Attending: Internal Medicine | Admitting: Internal Medicine

## 2016-11-16 ENCOUNTER — Encounter (HOSPITAL_COMMUNITY): Payer: Self-pay | Admitting: Emergency Medicine

## 2016-11-16 ENCOUNTER — Emergency Department (HOSPITAL_COMMUNITY): Payer: 59

## 2016-11-16 DIAGNOSIS — S93401S Sprain of unspecified ligament of right ankle, sequela: Secondary | ICD-10-CM

## 2016-11-16 DIAGNOSIS — X58XXXA Exposure to other specified factors, initial encounter: Secondary | ICD-10-CM | POA: Diagnosis present

## 2016-11-16 DIAGNOSIS — S93401A Sprain of unspecified ligament of right ankle, initial encounter: Secondary | ICD-10-CM | POA: Diagnosis present

## 2016-11-16 DIAGNOSIS — I2609 Other pulmonary embolism with acute cor pulmonale: Secondary | ICD-10-CM | POA: Diagnosis not present

## 2016-11-16 DIAGNOSIS — F419 Anxiety disorder, unspecified: Secondary | ICD-10-CM | POA: Diagnosis present

## 2016-11-16 DIAGNOSIS — I2699 Other pulmonary embolism without acute cor pulmonale: Secondary | ICD-10-CM | POA: Diagnosis present

## 2016-11-16 DIAGNOSIS — R7989 Other specified abnormal findings of blood chemistry: Secondary | ICD-10-CM | POA: Diagnosis present

## 2016-11-16 DIAGNOSIS — I248 Other forms of acute ischemic heart disease: Secondary | ICD-10-CM | POA: Diagnosis not present

## 2016-11-16 DIAGNOSIS — E871 Hypo-osmolality and hyponatremia: Secondary | ICD-10-CM | POA: Diagnosis not present

## 2016-11-16 DIAGNOSIS — R778 Other specified abnormalities of plasma proteins: Secondary | ICD-10-CM | POA: Diagnosis present

## 2016-11-16 DIAGNOSIS — I361 Nonrheumatic tricuspid (valve) insufficiency: Secondary | ICD-10-CM | POA: Diagnosis not present

## 2016-11-16 DIAGNOSIS — R748 Abnormal levels of other serum enzymes: Secondary | ICD-10-CM | POA: Diagnosis not present

## 2016-11-16 DIAGNOSIS — Z79899 Other long term (current) drug therapy: Secondary | ICD-10-CM | POA: Diagnosis not present

## 2016-11-16 DIAGNOSIS — Z86718 Personal history of other venous thrombosis and embolism: Secondary | ICD-10-CM

## 2016-11-16 DIAGNOSIS — W19XXXA Unspecified fall, initial encounter: Secondary | ICD-10-CM | POA: Diagnosis present

## 2016-11-16 DIAGNOSIS — Z793 Long term (current) use of hormonal contraceptives: Secondary | ICD-10-CM | POA: Diagnosis not present

## 2016-11-16 DIAGNOSIS — D72829 Elevated white blood cell count, unspecified: Secondary | ICD-10-CM | POA: Diagnosis present

## 2016-11-16 HISTORY — DX: Anal fissure, unspecified: K60.2

## 2016-11-16 HISTORY — DX: Anxiety disorder, unspecified: F41.9

## 2016-11-16 HISTORY — DX: Insomnia, unspecified: G47.00

## 2016-11-16 HISTORY — DX: Bronchitis, not specified as acute or chronic: J40

## 2016-11-16 HISTORY — DX: Bunion of right foot: M21.611

## 2016-11-16 HISTORY — DX: Bunion of right foot: M21.612

## 2016-11-16 LAB — BASIC METABOLIC PANEL
ANION GAP: 8 (ref 5–15)
Anion gap: 11 (ref 5–15)
BUN: 13 mg/dL (ref 6–20)
BUN: 13 mg/dL (ref 6–20)
CALCIUM: 8.2 mg/dL — AB (ref 8.9–10.3)
CHLORIDE: 105 mmol/L (ref 101–111)
CO2: 18 mmol/L — AB (ref 22–32)
CO2: 18 mmol/L — ABNORMAL LOW (ref 22–32)
CREATININE: 0.77 mg/dL (ref 0.44–1.00)
Calcium: 8.6 mg/dL — ABNORMAL LOW (ref 8.9–10.3)
Chloride: 108 mmol/L (ref 101–111)
Creatinine, Ser: 0.95 mg/dL (ref 0.44–1.00)
GFR calc Af Amer: >60 mL/min (ref >60)
GFR calc Af Amer: >60 mL/min (ref >60)
GFR calc non Af Amer: >60 mL/min (ref >60)
GLUCOSE: 117 mg/dL — AB (ref 65–99)
GLUCOSE: 130 mg/dL — AB (ref 65–99)
POTASSIUM: 3.7 mmol/L (ref 3.5–5.1)
Potassium: 4 mmol/L (ref 3.5–5.1)
Sodium: 131 mmol/L — ABNORMAL LOW (ref 135–145)
Sodium: 137 mmol/L (ref 135–145)

## 2016-11-16 LAB — I-STAT TROPONIN, ED: Troponin i, poc: 0.6 ng/mL (ref 0.00–0.08)

## 2016-11-16 LAB — CBC
HCT: 37.3 % (ref 36.0–46.0)
HEMATOCRIT: 38.9 % (ref 36.0–46.0)
HEMOGLOBIN: 12.5 g/dL (ref 12.0–15.0)
HEMOGLOBIN: 13.2 g/dL (ref 12.0–15.0)
MCH: 29.7 pg (ref 26.0–34.0)
MCH: 29.9 pg (ref 26.0–34.0)
MCHC: 33.5 g/dL (ref 30.0–36.0)
MCHC: 33.9 g/dL (ref 30.0–36.0)
MCV: 88 fL (ref 78.0–100.0)
MCV: 88.6 fL (ref 78.0–100.0)
Platelets: 272 10*3/uL (ref 150–400)
Platelets: 325 10*3/uL (ref 150–400)
RBC: 4.21 MIL/uL (ref 3.87–5.11)
RBC: 4.42 MIL/uL (ref 3.87–5.11)
RDW: 12.7 % (ref 11.5–15.5)
RDW: 12.7 % (ref 11.5–15.5)
WBC: 13.9 10*3/uL — ABNORMAL HIGH (ref 4.0–10.5)
WBC: 14.4 10*3/uL — ABNORMAL HIGH (ref 4.0–10.5)

## 2016-11-16 LAB — PROTIME-INR
INR: 0.93
Prothrombin Time: 12.3 seconds (ref 11.4–15.2)

## 2016-11-16 LAB — TROPONIN I
TROPONIN I: 0.37 ng/mL — AB (ref <0.03)
TROPONIN I: 0.23 ng/mL — AB (ref <0.03)
Troponin I: 0.67 ng/mL (ref <0.03)
Troponin I: 0.16 ng/mL (ref <0.03)

## 2016-11-16 LAB — HEPARIN LEVEL (UNFRACTIONATED)
HEPARIN UNFRACTIONATED: 0.56 [IU]/mL (ref 0.30–0.70)
HEPARIN UNFRACTIONATED: 0.48 [IU]/mL (ref 0.30–0.70)

## 2016-11-16 LAB — MRSA PCR SCREENING: MRSA by PCR: NEGATIVE

## 2016-11-16 LAB — APTT: APTT: 28 s (ref 24–36)

## 2016-11-16 LAB — ANTITHROMBIN III: ANTITHROMB III FUNC: 97 % (ref 75–120)

## 2016-11-16 LAB — HIV ANTIBODY (ROUTINE TESTING W REFLEX): HIV Screen 4th Generation wRfx: NONREACTIVE

## 2016-11-16 LAB — BRAIN NATRIURETIC PEPTIDE: B Natriuretic Peptide: 145.7 pg/mL — ABNORMAL HIGH (ref 0.0–100.0)

## 2016-11-16 MED ORDER — TRAZODONE HCL 50 MG PO TABS
50.0000 mg | ORAL_TABLET | Freq: Every day | ORAL | Status: DC
  Administered 2016-11-16: 25 mg via ORAL
  Administered 2016-11-17: 50 mg via ORAL
  Filled 2016-11-16 (×2): qty 1

## 2016-11-16 MED ORDER — HYDROCODONE-ACETAMINOPHEN 5-325 MG PO TABS
1.0000 | ORAL_TABLET | ORAL | Status: DC | PRN
  Administered 2016-11-17 – 2016-11-18 (×6): 1 via ORAL
  Filled 2016-11-16 (×6): qty 1

## 2016-11-16 MED ORDER — ACETAMINOPHEN 325 MG PO TABS
650.0000 mg | ORAL_TABLET | Freq: Four times a day (QID) | ORAL | Status: DC | PRN
  Administered 2016-11-16: 650 mg via ORAL
  Filled 2016-11-16: qty 2

## 2016-11-16 MED ORDER — HEPARIN BOLUS VIA INFUSION
5000.0000 [IU] | Freq: Once | INTRAVENOUS | Status: AC
  Administered 2016-11-16: 5000 [IU] via INTRAVENOUS
  Filled 2016-11-16: qty 5000

## 2016-11-16 MED ORDER — BISACODYL 5 MG PO TBEC
5.0000 mg | DELAYED_RELEASE_TABLET | Freq: Every day | ORAL | Status: DC | PRN
Start: 2016-11-16 — End: 2016-11-18
  Administered 2016-11-18: 5 mg via ORAL
  Filled 2016-11-16 (×2): qty 1

## 2016-11-16 MED ORDER — ADULT MULTIVITAMIN W/MINERALS CH
1.0000 | ORAL_TABLET | Freq: Every day | ORAL | Status: DC
  Administered 2016-11-17 – 2016-11-18 (×2): 1 via ORAL
  Filled 2016-11-16 (×2): qty 1

## 2016-11-16 MED ORDER — SENNOSIDES-DOCUSATE SODIUM 8.6-50 MG PO TABS
1.0000 | ORAL_TABLET | Freq: Every evening | ORAL | Status: DC | PRN
  Administered 2016-11-18: 1 via ORAL
  Filled 2016-11-16: qty 1

## 2016-11-16 MED ORDER — SODIUM CHLORIDE 0.9 % IV SOLN
INTRAVENOUS | Status: AC
  Administered 2016-11-16: 12:00:00 via INTRAVENOUS

## 2016-11-16 MED ORDER — ESCITALOPRAM OXALATE 10 MG PO TABS
20.0000 mg | ORAL_TABLET | Freq: Every day | ORAL | Status: DC
Start: 2016-11-16 — End: 2016-11-16

## 2016-11-16 MED ORDER — HYDROMORPHONE HCL 1 MG/ML IJ SOLN
0.5000 mg | Freq: Once | INTRAMUSCULAR | Status: AC
  Administered 2016-11-16: 0.5 mg via INTRAVENOUS
  Filled 2016-11-16: qty 1

## 2016-11-16 MED ORDER — ONDANSETRON HCL 4 MG/2ML IJ SOLN: 4.0000 mg | Freq: Four times a day (QID) | INTRAMUSCULAR | Status: DC | PRN

## 2016-11-16 MED ORDER — SODIUM CHLORIDE 0.9 % IV BOLUS (SEPSIS)
1000.0000 mL | Freq: Once | INTRAVENOUS | Status: AC
  Administered 2016-11-16: 1000 mL via INTRAVENOUS

## 2016-11-16 MED ORDER — ACETAMINOPHEN 650 MG RE SUPP: 650.0000 mg | Freq: Four times a day (QID) | RECTAL | Status: DC | PRN

## 2016-11-16 MED ORDER — SODIUM CHLORIDE 0.9% FLUSH
3.0000 mL | Freq: Two times a day (BID) | INTRAVENOUS | Status: DC
  Administered 2016-11-17 – 2016-11-18 (×2): 3 mL via INTRAVENOUS

## 2016-11-16 MED ORDER — HEPARIN (PORCINE) IN NACL 100-0.45 UNIT/ML-% IJ SOLN
1600.0000 [IU]/h | INTRAMUSCULAR | Status: AC
  Administered 2016-11-16 – 2016-11-17 (×3): 1500 [IU]/h via INTRAVENOUS
  Administered 2016-11-18: 1600 [IU]/h via INTRAVENOUS
  Filled 2016-11-16 (×6): qty 250

## 2016-11-16 MED ORDER — ONDANSETRON HCL 4 MG PO TABS: 4.0000 mg | ORAL_TABLET | Freq: Four times a day (QID) | ORAL | Status: DC | PRN

## 2016-11-16 MED ORDER — ESCITALOPRAM OXALATE 20 MG PO TABS
20.0000 mg | ORAL_TABLET | Freq: Every evening | ORAL | Status: DC
  Administered 2016-11-16 – 2016-11-18 (×3): 20 mg via ORAL
  Filled 2016-11-16 (×2): qty 1
  Filled 2016-11-16: qty 2
  Filled 2016-11-16: qty 1

## 2016-11-16 MED ORDER — HYDROMORPHONE HCL 1 MG/ML IJ SOLN
0.5000 mg | INTRAMUSCULAR | Status: DC | PRN
  Administered 2016-11-16 (×3): 1 mg via INTRAVENOUS
  Filled 2016-11-16 (×3): qty 1

## 2016-11-16 MED ORDER — ONDANSETRON HCL 4 MG/2ML IJ SOLN
4.0000 mg | Freq: Once | INTRAMUSCULAR | Status: AC
  Administered 2016-11-16: 4 mg via INTRAVENOUS
  Filled 2016-11-16: qty 2

## 2016-11-16 MED ORDER — IOPAMIDOL (ISOVUE-370) INJECTION 76%
INTRAVENOUS | Status: AC
  Administered 2016-11-16: 100 mL
  Filled 2016-11-16: qty 100

## 2016-11-16 NOTE — ED Notes (Signed)
Pt given inpatient hospital bed.

## 2016-11-16 NOTE — Progress Notes (Signed)
ANTICOAGULATION CONSULT NOTE - Follow Up Consult  Pharmacy Consult for Heparin Indication: pulmonary embolus  No Known Allergies  Patient Measurements: Height:  (172.7 cm) Weight: 230 lb (104.3 kg) IBW/kg (Calculated) : 63.9 Heparin Dosing Weight: 87.2 kg  Vital Signs: BP: 135/86 (09/30 1300) Pulse Rate: 88 (09/30 1300)  Labs:  Recent Labs  11/15/16 2354 11/16/16 0434 11/16/16 0515 11/16/16 1134  HGB 13.2  --  12.5  --   HCT 38.9  --  37.3  --   PLT 325  --  272  --   APTT 28  --   --   --   LABPROT 12.3  --   --   --   INR 0.93  --   --   --   HEPARINUNFRC  --   --   --  0.56  CREATININE 0.95 0.77  --   --   TROPONINI 0.67* 0.37*  --  0.23*    Estimated Creatinine Clearance: 121.7 mL/min (by C-G formula based on SCr of 0.77 mg/dL).   Medications:  Infusions:  . sodium chloride 90 mL/hr at 11/16/16 1226  . heparin 1,500 Units/hr (11/16/16 0411)    Assessment: 36 year old female on IV heparin for PE.   Initial Heparin level is therapeutic on 1500 units/hr.   Goal of Therapy:  Heparin level 0.3-0.7 units/ml Monitor platelets by anticoagulation protocol: Yes   Plan:  Continue Heparin at 1500 units/hr. Recheck heparin level this PM.   Link Snuffer, PharmD, BCPS Clinical Pharmacist Clinical phone 11/16/2016 until 3:30PM [phone] After hours, please call [phone] 11/16/2016,1:29 PM

## 2016-11-16 NOTE — H&P (Signed)
History and Physical    Graham Doukas ZOX:[id number] DOB: 11-10-1980 DOA: 11/16/2016  PCP: Wilfrid Lund, PA   Patient coming from: Home  Chief Complaint: Chest pain, DOE   HPI: Samantha [name] is a 36 y.o. female with medical history significant for anxiety, now presenting to the emergency department for evaluation of chest pain and exertional dyspnea. She walked up a hill yesterday, became very short of breath more quickly than expected, and then went on to develop aching in her chest, worse with a deep breath. These symptoms persisted and she also developed intermittent palpitations, worse with lying down. Patient sprained her right ankle a little over 2 weeks ago and has kept it immobilized in a boot. The ankle swelling had gone down initially, but seemed to return approximately 3 days ago. Patient reports being unusually fatigued for the past week. She takes oral contraception. She is a nonsmoker. Her mother had a PE in the postpartum period. Patient herself has never experienced VTE. No easy bruising or bleeding.  ED Course: Upon arrival to the ED, patient is found to be afebrile, saturating adequately on room air, tachycardic in the 110s, and vitals otherwise stable. EKG features a sinus tachycardia with rate 111 and nonspecific ST and T abnormalities. Chemistry panel is notable for sodium 131. CBC features a leukocytosis to 14,400 and is otherwise normal. INR is normal, troponin is elevated to 0.67, and BNP is elevated 246. CTA chest reveals acute PE with right heart strain. Patient was given a liter of normal saline and started on IV heparin infusion. PCCM was consulted by the ED physician, will see the patient in consultation, but recommends a hospitalist admission. Patient remains mildly tachycardic, but no acute respiratory distress while at rest. Pain has improved with a dose of Dilaudid in the ED. She will be admitted to the stepdown unit for ongoing evaluation and  management of acute PE with right heart strain.  Review of Systems:  All other systems reviewed and apart from HPI, are negative.  Past Medical History:  Diagnosis Date  . Anal fissure   . Anxiety   . Bilateral bunions   . Bronchitis   . Insomnia   . Leg fracture     Past Surgical History:  Procedure Laterality Date  . LEG SURGERY    . WISDOM TOOTH EXTRACTION       reports that she has never smoked. She has never used smokeless tobacco. She reports that she drinks alcohol. She reports that she does not use drugs.  No Known Allergies  Family History  Problem Relation Age of Onset  . Thyroid disease Mother   . Diabetes Father   . Cancer Paternal Grandfather        skin     Prior to Admission medications   Medication Sig Start Date End Date Taking? Authorizing Provider  AMBULATORY NON FORMULARY MEDICATION Nitroglycerine ointment 0.125 %  Apply a pea sized amount internally four times daily. Dispense 30 GM zero refill 10/02/16   Zehr, Shanda Bumps D, PA-C  DiphenhydrAMINE HCl (BENADRYL ALLERGY PO) Take by mouth.    [provider]  LEXAPRO 20 MG tablet  08/08/12   [provider]  lidocaine (XYLOCAINE) 5 % ointment Apply 1 application topically as needed.    [provider]  Melatonin 10 MG TABS Take by mouth.    [provider]  Multiple Vitamins-Minerals (MULTIVITAMIN WITH MINERALS) tablet Take 1 tablet by mouth daily.    [provider]  Norgestimate-Ethinyl Estradiol Triphasic (ORTHO TRI-CYCLEN LO) 0.18/0.215/0.25 MG-25 MCG tab Take 1 tablet by mouth daily.    [provider]  polycarbophil (FIBERCON) 625 MG tablet Take 625 mg by mouth daily.    [provider]  TRAZODONE HCL PO Take 5 mg by mouth at bedtime.    [provider]    Physical Exam: Vitals:   11/16/16 0215 11/16/16 0300 11/16/16 0345 11/16/16 0415  BP: (!) 158/97 (!) 159/106 (!) 164/107 (!) 144/100  Pulse: (!) 107 (!) 105 (!) 110 (!) 102   Resp: 16 18 (!) 22 20  Temp:      TempSrc:      SpO2: 100% 100% 100% 100%  Weight:      Height:          Constitutional: NAD, calm, mild dyspnea with speech Eyes: PERTLA, lids and conjunctivae normal ENMT: Mucous membranes are moist. Posterior pharynx clear of any exudate or lesions.   Neck: normal, supple, no masses, no thyromegaly Respiratory: clear to auscultation bilaterally, no wheezing, no crackles. Mild dyspnea with speech. No accessory muscle use.  Cardiovascular: Rate ~110 and regular. Trace edema to lower right leg. No significant JVD. Abdomen: No distension, no tenderness, no masses palpated. Bowel sounds normal.  Musculoskeletal: Right ankle immobilized in orthopedic boot. No joint deformity upper and lower extremities.   Skin: no significant rashes, lesions, ulcers. Warm, dry, well-perfused. Neurologic: CN 2-12 grossly intact. Sensation intact. Strength 5/5 in all 4 limbs.  Psychiatric: Alert and oriented x 3. Pleasant, cooperative.     Labs on Admission: I have personally reviewed following labs and imaging studies  CBC:  Recent Labs Lab 11/15/16 2354  WBC 14.4*  HGB 13.2  HCT 38.9  MCV 88.0  PLT 325   Basic Metabolic Panel:  Recent Labs Lab 11/15/16 2354  NA 131*  K 3.7  CL 105  CO2 18*  GLUCOSE 117*  BUN 13  CREATININE 0.95  CALCIUM 8.6*   GFR: Estimated Creatinine Clearance: 102.5 mL/min (by C-G formula based on SCr of 0.95 mg/dL). Liver Function Tests: No results for input(s): AST, ALT, ALKPHOS, BILITOT, PROT, ALBUMIN in the last 168 hours. No results for input(s): LIPASE, AMYLASE in the last 168 hours. No results for input(s): AMMONIA in the last 168 hours. Coagulation Profile:  Recent Labs Lab 11/15/16 2354  INR 0.93   Cardiac Enzymes:  Recent Labs Lab 11/15/16 2354  TROPONINI 0.67*   BNP (last 3 results) No results for input(s): PROBNP in the last 8760 hours. HbA1C: No results for input(s): HGBA1C in the last 72  hours. CBG: No results for input(s): GLUCAP in the last 168 hours. Lipid Profile: No results for input(s): CHOL, HDL, LDLCALC, TRIG, CHOLHDL, LDLDIRECT in the last 72 hours. Thyroid Function Tests: No results for input(s): TSH, T4TOTAL, FREET4, T3FREE, THYROIDAB in the last 72 hours. Anemia Panel: No results for input(s): VITAMINB12, FOLATE, FERRITIN, TIBC, IRON, RETICCTPCT in the last 72 hours. Urine analysis: No results found for: COLORURINE, APPEARANCEUR, LABSPEC, PHURINE, GLUCOSEU, HGBUR, BILIRUBINUR, KETONESUR, PROTEINUR, UROBILINOGEN, NITRITE, LEUKOCYTESUR Sepsis Labs: (procalcitonin:4,lacticidven:4) )No results found for this or any previous visit (from the past 240 hour(s)).   Radiological Exams on Admission: Ct Angio Chest Pe W Or Wo Contrast  Result Date: 11/16/2016 CLINICAL DATA:  36 y/o F; 36 y/o F; left-sided chest pain radiating to the back. EXAM: CT ANGIOGRAPHY CHEST WITH CONTRAST TECHNIQUE: Multidetector CT imaging of the chest was performed using the standard protocol during bolus administration  of intravenous contrast. Multiplanar CT image reconstructions and MIPs were obtained to evaluate the vascular anatomy. CONTRAST:  61 cc Isovue 370 COMPARISON:  None. FINDINGS: Cardiovascular: Extensive bilateral pulmonary embolus, no saddle embolus. RV/ LV equals 1.5. Normal heart size. No pericardial effusion. Normal thoracic aorta. Mediastinum/Nodes: No enlarged mediastinal, hilar, or axillary lymph nodes. Thyroid gland, trachea, and esophagus demonstrate no significant findings. Lungs/Pleura: Lungs are clear. No pleural effusion or pneumothorax. Upper Abdomen: No acute abnormality. Musculoskeletal: No chest wall abnormality. No acute or significant osseous findings. Review of the MIP images confirms the above findings. IMPRESSION: Positive for acute PE with CT evidence of right heart strain (RV/LV Ratio = 1.5) consistent with at least submassive (intermediate risk) PE. The  presence of right heart strain has been associated with an increased risk of morbidity and mortality. Please activate Code PE by paging [phone]. These results were called by telephone at the time of interpretation on 11/16/2016 at 3:26 am to Dr. Jaci Carrel , who verbally acknowledged these results. Electronically Signed   By: Mitzi Hansen M.D.   On: 11/16/2016 03:27    EKG: Independently reviewed. Sinus tachycardia (rate 111), non-specific ST and T abnormalities.   Assessment/Plan  1. Acute PE with heart-strain  - Pt presents with pleuritic pain and DOE, found to have acute PE with CT-evidence for right heart-strain - Risk-factors include hormonal contraception, immobilized right ankle, sedentary lifestyle; FHx of PE in mother during postpartum period  - She has not been hypotensive or hypoxic  - She was started on heparin infusion in ED  - PCCM is consulting and much appreciated, will follow-up on recommendations  - Plan to continue heparin infusion, continue cardiac monitoring, obtain echocardiogram and LE venous dopplers, trend troponin    2. Elevated troponin - Troponin elevated to 0.67  - No hx of CAD, likely secondary to PE  - Continue heparin and cardiac monitoring, trend troponin, follow-up echo   3. Anxiety  - Stable  - Continue Lexapro   4. Right ankle sprain  - Negative radiographs on 10/30/16  - Following with ortho  - Continue supportive care with prn analgesia    5. Hyponatremia - Serum sodium 131 on admission  - Treated with 1 liter NS in ED - Repeat chem panel in am   6. Leukocytosis  - WBC 14,400 on admission  - Pt afebrile, no infection identified  - Likely reactive to #1, will culture if febrile   DVT prophylaxis: IV heparin infusion Code Status: Full  Family Communication: Husband updated at bedside Disposition Plan: Admit to SDU Consults called: PCCM Admission status: Inpatient    Briscoe Deutscher, MD [facility] Pager [phone]  If 7PM-7AM, please contact night-coverage [email or web address] Password [email or web address]  11/16/2016, 4:29 AM

## 2016-11-16 NOTE — ED Notes (Signed)
Admit. MD at bedside, no needs from pt at this time.

## 2016-11-16 NOTE — ED Provider Notes (Addendum)
[facility]-EMERGENCY DEPT Provider Note   CSN: [id number] Arrival date & time: 11/15/16  2335     History   Chief Complaint Chief Complaint  Patient presents with  . Chest Pain    HPI Samantha [name] Carmelina Dane is a 36 y.o. female.  Patient presents to the ER for evaluation of chest pain.  Patient reports that she had onset of chest pain after exerting herself by walking up hill today.  Patient reports that the pain is continuous but gets much worse when she takes a deep breath.  She has been experiencing palpitations.  She has not had any loss of consciousness.  There has not been any fever.      Past Medical History:  Diagnosis Date  . Anal fissure   . Anxiety   . Bilateral bunions   . Bronchitis   . Insomnia   . Leg fracture     Patient Active Problem List   Diagnosis Date Noted  . Anal fissure 07/08/2016  . Rectal bleeding 07/08/2016  . Hemorrhoids 08/27/2012    Past Surgical History:  Procedure Laterality Date  . LEG SURGERY    . WISDOM TOOTH EXTRACTION      OB History    No data available       Home Medications    Prior to Admission medications   Medication Sig Start Date End Date Taking? Authorizing Provider  AMBULATORY NON FORMULARY MEDICATION Nitroglycerine ointment 0.125 %  Apply a pea sized amount internally four times daily. Dispense 30 GM zero refill 10/02/16   Zehr, Shanda Bumps D, PA-C  DiphenhydrAMINE HCl (BENADRYL ALLERGY PO) Take by mouth.    [provider]  LEXAPRO 20 MG tablet  08/08/12   [provider]  lidocaine (XYLOCAINE) 5 % ointment Apply 1 application topically as needed.    [provider]  Melatonin 10 MG TABS Take by mouth.    [provider]  Multiple Vitamins-Minerals (MULTIVITAMIN WITH MINERALS) tablet Take 1 tablet by mouth daily.    [provider]  Norgestimate-Ethinyl Estradiol Triphasic (ORTHO TRI-CYCLEN LO) 0.18/0.215/0.25 MG-25 MCG tab Take 1 tablet by mouth daily.    [provider]  polycarbophil (FIBERCON) 625 MG tablet Take 625 mg by mouth daily.    [provider]  TRAZODONE HCL PO Take 5 mg by mouth at bedtime.    [provider]    Family History Family History  Problem Relation Age of Onset  . Thyroid disease Mother   . Diabetes Father   . Cancer Paternal Grandfather        skin    Social History Social History  Substance Use Topics  . Smoking status: Never Smoker  . Smokeless tobacco: Never Used  . Alcohol use Yes     Allergies   Patient has no known allergies.   Review of Systems Review of Systems  Respiratory: Positive for chest tightness and shortness of breath.   Cardiovascular: Positive for chest pain and palpitations.  All other systems reviewed and are negative.    Physical Exam Updated Vital Signs BP (!) 148/102   Pulse (!) 107   Temp 98.1 F (36.7 C) (Oral)   Resp 17   Ht  (1.727 m)   Wt 104.3 kg (230 lb)   SpO2 100%   BMI 34.97 kg/m   Physical Exam  Constitutional: She is oriented to person, place, and time. She appears well-developed and well-nourished. No distress.  HENT:  Head: Normocephalic and atraumatic.  Right Ear: Hearing normal.  Left Ear: Hearing normal.  Nose: Nose normal.  Mouth/Throat: Oropharynx is clear and moist and mucous membranes are normal.  Eyes: Pupils are equal, round, and reactive to light. Conjunctivae and EOM are normal.  Neck: Normal range of motion. Neck supple.  Cardiovascular: Regular rhythm, S1 normal and S2 normal.  Tachycardia present.  Exam reveals no gallop and no friction rub.   No murmur heard. Pulmonary/Chest: Effort normal and breath sounds normal. No respiratory distress. She exhibits no tenderness.  Abdominal: Soft. Normal appearance and bowel sounds are normal. There is no hepatosplenomegaly. There is no tenderness. There is no rebound, no guarding, no tenderness at McBurney's point and negative Murphy's sign. No hernia.    Musculoskeletal: Normal range of motion.  Cam walker right foot  Neurological: She is alert and oriented to person, place, and time. She has normal strength. No cranial nerve deficit or sensory deficit. Coordination normal. GCS eye subscore is 4. GCS verbal subscore is 5. GCS motor subscore is 6.  Skin: Skin is warm, dry and intact. No rash noted. No cyanosis.  Psychiatric: She has a normal mood and affect. Her speech is normal and behavior is normal. Thought content normal.  Nursing note and vitals reviewed.    ED Treatments / Results  Labs (all labs ordered are listed, but only abnormal results are displayed) Labs Reviewed  BASIC METABOLIC PANEL - Abnormal; Notable for the following:       Result Value   Sodium 131 (*)    CO2 18 (*)    Glucose, Bld 117 (*)    Calcium 8.6 (*)    All other components within normal limits  CBC - Abnormal; Notable for the following:    WBC 14.4 (*)    All other components within normal limits  TROPONIN I - Abnormal; Notable for the following:    Troponin I 0.67 (*)    All other components within normal limits  BRAIN NATRIURETIC PEPTIDE - Abnormal; Notable for the following:    B Natriuretic Peptide 145.7 (*)    All other components within normal limits  I-STAT TROPONIN, ED - Abnormal; Notable for the following:    Troponin i, poc 0.60 (*)    All other components within normal limits  PROTIME-INR  APTT  ANTITHROMBIN III  PROTEIN C ACTIVITY  PROTEIN C, TOTAL  PROTEIN S ACTIVITY  PROTEIN S, TOTAL  LUPUS ANTICOAGULANT PANEL  BETA-2-GLYCOPROTEIN I ABS, IGG/M/A  HOMOCYSTEINE  FACTOR 5 LEIDEN  PROTHROMBIN GENE MUTATION  CARDIOLIPIN ANTIBODIES, IGG, IGM, IGA    EKG  EKG Interpretation  Date/Time:  Saturday November 15 2016 23:39:40 EDT Ventricular Rate:  111 PR Interval:  138 QRS Duration: 74 QT Interval:  310 QTC Calculation: 421 R Axis:   86 Text Interpretation:  Sinus tachycardia Possible Left atrial enlargement Borderline ECG  Nonspecific ST and T wave abnormality No previous tracing Confirmed by Gilda Crease [phone]) on 11/16/2016 12:25:56 AM       Radiology Ct Angio Chest Pe W Or Wo Contrast  Result Date: 11/16/2016 CLINICAL DATA:  36 y/o F; 36 y/o F; left-sided chest pain radiating to the back. EXAM: CT ANGIOGRAPHY CHEST WITH CONTRAST TECHNIQUE: Multidetector CT imaging of the chest was performed using the standard protocol during bolus administration of intravenous contrast. Multiplanar CT image reconstructions and MIPs were obtained to evaluate the vascular anatomy. CONTRAST:  61 cc Isovue 370 COMPARISON:  None. FINDINGS: Cardiovascular: Extensive bilateral pulmonary embolus, no saddle embolus. RV/  LV equals 1.5. Normal heart size. No pericardial effusion. Normal thoracic aorta. Mediastinum/Nodes: No enlarged mediastinal, hilar, or axillary lymph nodes. Thyroid gland, trachea, and esophagus demonstrate no significant findings. Lungs/Pleura: Lungs are clear. No pleural effusion or pneumothorax. Upper Abdomen: No acute abnormality. Musculoskeletal: No chest wall abnormality. No acute or significant osseous findings. Review of the MIP images confirms the above findings. IMPRESSION: Positive for acute PE with CT evidence of right heart strain (RV/LV Ratio = 1.5) consistent with at least submassive (intermediate risk) PE. The presence of right heart strain has been associated with an increased risk of morbidity and mortality. Please activate Code PE by paging [phone]. These results were called by telephone at the time of interpretation on 11/16/2016 at 3:26 am to Dr. Jaci Carrel , who verbally acknowledged these results. Electronically Signed   By: Mitzi Hansen M.D.   On: 11/16/2016 03:27    Procedures Procedures (including critical care time)  Medications Ordered in ED Medications  sodium chloride 0.9 % bolus 1,000 mL (1,000 mLs Intravenous New Bag/Given 11/16/16 0212)  iopamidol  (ISOVUE-370) 76 % injection (100 mLs  Contrast Given 11/16/16 0242)     Initial Impression / Assessment and Plan / ED Course  I have reviewed the triage vital signs and the nursing notes.  Pertinent labs & imaging results that were available during my care of the patient were reviewed by me and considered in my medical decision making (see chart for details).     Patient presents to the emergency department for evaluation of chest pain. She has noticed shortness of breath, dyspnea on exertion which began earlier today. Pain worsens with taking a deep breath. At arrival she is not dyspneic or hypoxic. She is noted to be tachycardic, however. Patient does have a fracture of her right ankle, in a walking boot but has not had surgery. She does admit that she has not been able to get as much exercise as normal because of this. She also has a family history of blood clotting but no known hypercoagulable pathology.  Blood work performed at arrival did reveal elevated troponin. No obvious ischemia or infarct on EKG. Patient was felt to be more consistent with PE with an acute coronary syndrome, sent for CT scan which did confirm bilateral PE with right heart strain.  Discussed with Dr. Jamison Neighbor, on call for pulmonary/critical care. Agrees with heparinization and will consult on the patient, but feels patient is safe for admission to stepdown unit by hospitalist.  CRITICAL CARE Performed by: Gilda Crease.   Total critical care time: 30 minutes  Critical care time was exclusive of separately billable procedures and treating other patients.  Critical care was necessary to treat or prevent imminent or life-threatening deterioration.  Critical care was time spent personally by me on the following activities: development of treatment plan with patient and/or surrogate as well as nursing, discussions with consultants, evaluation of patient's response to treatment, examination of patient, obtaining  history from patient or surrogate, ordering and performing treatments and interventions, ordering and review of laboratory studies, ordering and review of radiographic studies, pulse oximetry and re-evaluation of patient's condition.   Final Clinical Impressions(s) / ED Diagnoses   Final diagnoses:  PE (pulmonary thromboembolism) (HCC)    New Prescriptions New Prescriptions   No medications on file     Gilda Crease, MD 11/16/16 0981    Gilda Crease, MD 12/19/16 [phone]

## 2016-11-16 NOTE — ED Notes (Signed)
Positive troponin called.  Moved to room at this time.

## 2016-11-16 NOTE — Progress Notes (Signed)
Samantha [name] is a 36 y.o. female with medical history significant for anxiety, now presenting to the emergency department for evaluation of chest pain and exertional dyspnea. She was found to have acute PE, started on IV heparin and admitted . Echocardiogram and LE duplex ordered.    Kathlen Mody, MD [phone]

## 2016-11-16 NOTE — Progress Notes (Signed)
ANTICOAGULATION CONSULT NOTE - Follow Up Consult  Pharmacy Consult for Heparin Indication: pulmonary embolus  No Known Allergies  Patient Measurements: Height:  (172.7 cm) Weight: 230 lb (104.3 kg) IBW/kg (Calculated) : 63.9 Heparin Dosing Weight: 87.2 kg  Vital Signs: BP: 150/92 (09/30 1800) Pulse Rate: 83 (09/30 1800)  Labs:  Recent Labs  11/15/16 2354 11/16/16 0434 11/16/16 0515 11/16/16 1134 11/16/16 1719  HGB 13.2  --  12.5  --   --   HCT 38.9  --  37.3  --   --   PLT 325  --  272  --   --   APTT 28  --   --   --   --   LABPROT 12.3  --   --   --   --   INR 0.93  --   --   --   --   HEPARINUNFRC  --   --   --  0.56 0.48  CREATININE 0.95 0.77  --   --   --   TROPONINI 0.67* 0.37*  --  0.23* 0.16*    Estimated Creatinine Clearance: 121.7 mL/min (by C-G formula based on SCr of 0.77 mg/dL).   Medications:  Infusions:  . heparin 1,500 Units/hr (11/16/16 0411)    Assessment: 36 year old female on IV heparin for PE. No anticoagulation PTA.  Heparin level therapeutic x2 on 1500 units/hr.  CBC stable, no bleeding noted.   Goal of Therapy:  Heparin level 0.3-0.7 units/ml Monitor platelets by anticoagulation protocol: Yes   Plan:  Continue Heparin at 1500 units/hr. Daily heparin level and CBC Follow long term anticoagulation plans  [name] D. Huxley Shurley, PharmD, BCPS Clinical Pharmacist 11/16/2016 6:49 PM

## 2016-11-16 NOTE — ED Notes (Signed)
Date and time results received: 11/16/16 1:31 AM  (use smartphrase ".now" to insert current time)  Test: Troponin Critical Value: 0.67  Name of Provider Notified: Dr. Blinda Leatherwood and primary RN Koleen Nimrod   Orders Received? Or Actions Taken?: plan to place orders for heparin infusion.

## 2016-11-16 NOTE — Consult Note (Signed)
Name: Samantha [name] MRN: [id number] DOB: 01-03-1981    ADMISSION DATE:  11/16/2016 CONSULTATION DATE:  11/16/2016  REFERRING MD :  [facility] /Dr. Opyd   CHIEF COMPLAINT:  PE    BRIEF PATIENT DESCRIPTION: 36 yo female presented to ER 11/16/16 with dyspnea . Found to have an acute PE on CT chest . Appears to be provoked with Right ankle injury/boot immobilization on BCP . There is Right heart strain on CT chest . Pulmonary consulted to evaluate for EKOS .   SIGNIFICANT EVENTS  Admitted 9/30 with Acute bilateral PE   STUDIES:  CTa Chest 11/16/16 >extensive bilateral PE , Ratio 1.5 . +evidence of RHS .    HISTORY OF PRESENT ILLNESS:   36 year old female never smoker , presented to the emergency room on 11/16/2016 with 2 day history of shortness of breath and chest pain. In the emergency room. Patient was found to have an acute bilateral pulmonary embolism. CT chest showed extensive bilateral PE with positive evidence of right heart strain. Ratio 1.5. Patient was started on a heparin drip. Patient says she had an right ankle fracture around 2 weeks ago after a fall off curbing.  She had to wear a boot immobilizer. Patient is on oral contraception. She does have a family history of VTE , mother had a PE in the postpartum period. Patient denied any hemoptysis, fever, syncope, abdominal pain, nausea, vomiting, diarrhea or bloody stool.  Patient's vital signs have been stable . O2 sats on room air on arrival were normal.  She's had some mild tachycardia with heart rate 90-110. Blood pressure has been stable..Troponin was slightly elevated 0.67 but trending down.   She is sitting up in bed . No dyspnea at rest and no increased wob. She does have right ankle and calf pain .  Pulmonary was consulted for consideration of EKOS -catheter directed lysis .     PAST MEDICAL HISTORY :   has a past medical history of Anal fissure; Anxiety; Bilateral bunions; Bronchitis; Insomnia; and Leg fracture.  has  a past surgical history that includes Leg Surgery and Wisdom tooth extraction. Prior to Admission medications   Medication Sig Start Date End Date Taking? Authorizing Provider  AMBULATORY NON FORMULARY MEDICATION Nitroglycerine ointment 0.125 %  Apply a pea sized amount internally four times daily. Dispense 30 GM zero refill Patient taking differently: Apply 1 application topically daily as needed (chest pain). Nitroglycerine ointment 0.125 % 10/02/16  Yes Zehr, Princella Pellegrini, PA-C  diphenhydrAMINE (BENADRYL) 25 MG tablet Take 25 mg by mouth at bedtime.   Yes [provider]  LEXAPRO 20 MG tablet Take 20 mg by mouth daily.  08/08/12  Yes [provider]  lidocaine (XYLOCAINE) 5 % ointment Apply 1 application topically 2 (two) times daily as needed for mild pain.    Yes [provider]  Melatonin 10 MG TABS Take 10 mg by mouth at bedtime.    Yes [provider]  Multiple Vitamins-Minerals (MULTIVITAMIN WITH MINERALS) tablet Take 1 tablet by mouth daily.   Yes [provider]  Norgestimate-Ethinyl Estradiol Triphasic (ORTHO TRI-CYCLEN LO) 0.18/0.215/0.25 MG-25 MCG tab Take 1 tablet by mouth daily.   Yes [provider]  polyethylene glycol (MIRALAX / GLYCOLAX) packet Take 17 g by mouth daily as needed for mild constipation.   Yes [provider]  traZODone (DESYREL) 50 MG tablet Take 25 mg by mouth at bedtime.   Yes [provider]   No Known Allergies  FAMILY HISTORY:  family history includes Cancer in her paternal grandfather; Diabetes in her father; Thyroid disease in her mother. SOCIAL HISTORY:  reports that she has never smoked. She has never used smokeless tobacco. She reports that she drinks alcohol. She reports that she does not use drugs.  REVIEW OF SYSTEMS:   Constitutional: Negative for fever, chills, weight loss, malaise/fatigue and diaphoresis.  HENT: Negative for hearing loss, ear pain, nosebleeds, congestion, sore  throat, neck pain, tinnitus and ear discharge.   Eyes: Negative for blurred vision, double vision, photophobia, pain, discharge and redness.  Respiratory: Negative for cough, hemoptysis, sputum production,  wheezing and stridor.  +sob  Cardiovascular: Negative for  palpitations, orthopnea, claudication, leg swelling and PND.  +chest pain  Gastrointestinal: Negative for heartburn, nausea, vomiting, abdominal pain, diarrhea, constipation, blood in stool and melena.  Genitourinary: Negative for dysuria, urgency, frequency, hematuria and flank pain.  Musculoskeletal: Negative for myalgias, back pain  +ankle injury -right ankle fx and swelling  Skin: Negative for itching and rash.  Neurological: Negative for dizziness, tingling, tremors, sensory change, speech change, focal weakness, seizures, loss of consciousness, weakness and headaches.  Endo/Heme/Allergies: Negative for environmental allergies and polydipsia. Does not bruise/bleed easily.  SUBJECTIVE: PE   VITAL SIGNS: Temp:  [98.1 F (36.7 C)] 98.1 F (36.7 C) (09/29 2346) Pulse Rate:  [79-112] 88 (09/30 1300) Resp:  [11-22] 11 (09/30 1300) BP: (121-164)/(83-107) 135/86 (09/30 1300) SpO2:  [96 %-100 %] 100 % (09/30 1300) Weight:  [230 lb (104.3 kg)] 230 lb (104.3 kg) (09/29 2346)  PHYSICAL EXAMINATION: GEN: A/Ox3; pleasant , NAD, well nourished , obese    HEENT:  [place]/AT,   , NOSE-clear, THROAT-clear, no lesions, no postnasal drip or exudate noted.   NECK:  Supple w/ fair ROM; no JVD; normal carotid impulses w/o bruits; no thyromegaly or nodules palpated; no lymphadenopathy.    RESP  Clear  P & A; w/o, wheezes/ rales/ or rhonchi. no accessory muscle use, no dullness to percussion  CARD:  RRR, no m/r/g  , no peripheral edema, pulses intact, no cyanosis or clubbing. +homans sign on right   GI:   Soft & nt; nml bowel sounds; no organomegaly or masses detected.   Musco: Warm bil, no deformities or joint swelling noted. Right ankle  tenderness   Neuro: alert, no focal deficits noted.    Skin: Warm, no lesions or rashes    Recent Labs Lab 11/15/16 2354 11/16/16 0434  NA 131* 137  K 3.7 4.0  CL 105 108  CO2 18* 18*  BUN 13 13  CREATININE 0.95 0.77  GLUCOSE 117* 130*    Recent Labs Lab 11/15/16 2354 11/16/16 0515  HGB 13.2 12.5  HCT 38.9 37.3  WBC 14.4* 13.9*  PLT 325 272   Ct Angio Chest Pe W Or Wo Contrast  Result Date: 11/16/2016 CLINICAL DATA:  36 y/o F; 36 y/o F; left-sided chest pain radiating to the back. EXAM: CT ANGIOGRAPHY CHEST WITH CONTRAST TECHNIQUE: Multidetector CT imaging of the chest was performed using the standard protocol during bolus administration of intravenous contrast. Multiplanar CT image reconstructions and MIPs were obtained to evaluate the vascular anatomy. CONTRAST:  61 cc Isovue 370 COMPARISON:  None. FINDINGS: Cardiovascular: Extensive bilateral pulmonary embolus, no saddle embolus. RV/ LV equals 1.5. Normal heart size. No pericardial effusion. Normal thoracic aorta. Mediastinum/Nodes: No enlarged mediastinal, hilar, or axillary lymph nodes. Thyroid gland, trachea, and esophagus demonstrate no significant findings. Lungs/Pleura: Lungs are clear. No pleural effusion or  pneumothorax. Upper Abdomen: No acute abnormality. Musculoskeletal: No chest wall abnormality. No acute or significant osseous findings. Review of the MIP images confirms the above findings. IMPRESSION: Positive for acute PE with CT evidence of right heart strain (RV/LV Ratio = 1.5) consistent with at least submassive (intermediate risk) PE. The presence of right heart strain has been associated with an increased risk of morbidity and mortality. Please activate Code PE by paging [phone]. These results were called by telephone at the time of interpretation on 11/16/2016 at 3:26 am to Dr. Jaci Carrel , who verbally acknowledged these results. Electronically Signed   By: Mitzi Hansen M.D.   On:  11/16/2016 03:27    ASSESSMENT / PLAN:  1. Bilateral Acute PE  CT chest shows Right heart strain with 1.5 ratio. There is moderate clot burden.  Pt Vital signs are stable with normal blood pressure, heart rate. o2 sats are normal on room air with no increased wob.  For now would not recommend EKOS/Targeted catheter  lysis . Pending 2 D echo , if RHS is present will need to re-evaluate .   Plan  Continue on Heparin Drip .  2 D echo pending  Venous doppler pending     Rubye Oaks NP -C  Pulmonary and Critical Care Medicine [facility] Pager: [phone] 11/16/2016, 2:01 PM  Attending Note:  I have examined patient, reviewed labs, studies and notes. I have discussed the case with T Parrett, and I agree with the data and plans as amended above. 36 year old woman with little past medical history. She twisted and fractured her right ankle 2 weeks ago and it has been immobilized in a walking boot. She developed thirst no dyspnea on 9/28 then progressive pleuritic chest pain. She was evaluated in the emergency department and found to have bilateral pulmonary emboli by CT scan. Her RV LV ratio was 1.5 and she had a mildly elevated troponin. She was started on heparin and we will consulted for evaluation for possible targeted lysis. On my evaluation she is perfectly comfortable. She is on room air, hemodynamically stable. Her tachycardia has resolved. Given her hemodynamic instability I do not believe there is any indication for EKOS lysis at this time. An echocardiogram is ordered and if she were to show evidence for significant right heart strain then we may reconsider and consult interventional radiology. This appears to be a provoked clot as she had the right ankle injury and she is also on oral birth control medication. She was counseled regarding stopping the OCP and finding an alternative. She will need to be on anticoagulation for 3-6 months and then have her hyper-coagulability  panel completed. If her echocardiogram shows right heart strain then we will also consider EKOS prior to converting from heparin.   Levy Pupa, MD, PhD 11/16/2016, 3:35 PM [facility] [phone] or if no answer [phone]

## 2016-11-16 NOTE — Progress Notes (Signed)
ANTICOAGULATION CONSULT NOTE - Initial Consult  Pharmacy Consult for Heparin  Indication: pulmonary embolus  No Known Allergies  Patient Measurements: Height:  (172.7 cm) Weight: 230 lb (104.3 kg) IBW/kg (Calculated) : 63.9  Vital Signs: Temp: 98.1 F (36.7 C) (09/29 2346) Temp Source: Oral (09/29 2346) BP: 148/102 (09/30 0203) Pulse Rate: 107 (09/30 0203)  Labs:  Recent Labs  11/15/16 2354  HGB 13.2  HCT 38.9  PLT 325  APTT 28  LABPROT 12.3  INR 0.93  CREATININE 0.95  TROPONINI 0.67*    Estimated Creatinine Clearance: 102.5 mL/min (by C-G formula based on SCr of 0.95 mg/dL).   Medical History: Past Medical History:  Diagnosis Date  . Anal fissure   . Anxiety   . Bilateral bunions   . Bronchitis   . Insomnia   . Leg fracture     Assessment: 36 y/o F with radiating left sided CP found to have extensive bilateral pulmonary embolus. Starting heparin. CBC good. Renal function good. PTA meds reviewed.   Goal of Therapy:  Heparin level 0.3-0.7 units/ml Monitor platelets by anticoagulation protocol: Yes   Plan:  Heparin 5000 units BOLUS Start heparin drip at 1500 units/hr 1100 HL Daily CBC/HL Monitor for bleeding   Abran Duke 11/16/2016,3:44 AM

## 2016-11-16 NOTE — ED Notes (Signed)
Per Dr. Rhunette Croft no Ddimer at this time

## 2016-11-17 ENCOUNTER — Inpatient Hospital Stay (HOSPITAL_COMMUNITY): Payer: 59

## 2016-11-17 ENCOUNTER — Other Ambulatory Visit (HOSPITAL_COMMUNITY): Payer: 59

## 2016-11-17 DIAGNOSIS — R748 Abnormal levels of other serum enzymes: Secondary | ICD-10-CM

## 2016-11-17 DIAGNOSIS — F419 Anxiety disorder, unspecified: Secondary | ICD-10-CM

## 2016-11-17 DIAGNOSIS — I361 Nonrheumatic tricuspid (valve) insufficiency: Secondary | ICD-10-CM

## 2016-11-17 DIAGNOSIS — E871 Hypo-osmolality and hyponatremia: Secondary | ICD-10-CM

## 2016-11-17 DIAGNOSIS — Z86718 Personal history of other venous thrombosis and embolism: Secondary | ICD-10-CM

## 2016-11-17 DIAGNOSIS — I2699 Other pulmonary embolism without acute cor pulmonale: Secondary | ICD-10-CM

## 2016-11-17 LAB — ECHOCARDIOGRAM COMPLETE
Height: 68 in
WEIGHTICAEL: 3819.2 [oz_av]

## 2016-11-17 LAB — CBC
HCT: 36 % (ref 36.0–46.0)
Hemoglobin: 12.1 g/dL (ref 12.0–15.0)
MCH: 30.2 pg (ref 26.0–34.0)
MCHC: 33.6 g/dL (ref 30.0–36.0)
MCV: 89.8 fL (ref 78.0–100.0)
PLATELETS: 242 10*3/uL (ref 150–400)
RBC: 4.01 MIL/uL (ref 3.87–5.11)
RDW: 12.7 % (ref 11.5–15.5)
WBC: 9.7 10*3/uL (ref 4.0–10.5)

## 2016-11-17 LAB — LUPUS ANTICOAGULANT PANEL
DRVVT: 30.7 s (ref 0.0–47.0)
PTT LA: 29.5 s (ref 0.0–51.9)

## 2016-11-17 LAB — HOMOCYSTEINE: Homocysteine: 7.2 umol/L (ref 0.0–15.0)

## 2016-11-17 LAB — PROTEIN S, TOTAL: PROTEIN S AG TOTAL: 74 % (ref 60–150)

## 2016-11-17 LAB — PROTEIN C ACTIVITY: Protein C Activity: 149 % (ref 73–180)

## 2016-11-17 LAB — PROTEIN S ACTIVITY: Protein S Activity: 56 % — ABNORMAL LOW (ref 63–140)

## 2016-11-17 LAB — HEPARIN LEVEL (UNFRACTIONATED): Heparin Unfractionated: 0.28 IU/mL — ABNORMAL LOW (ref 0.30–0.70)

## 2016-11-17 MED ORDER — PERFLUTREN LIPID MICROSPHERE
1.0000 mL | INTRAVENOUS | Status: AC | PRN
  Administered 2016-11-17: 2 mL via INTRAVENOUS
  Filled 2016-11-17: qty 10

## 2016-11-17 NOTE — Plan of Care (Signed)
Problem: Education: Goal: Knowledge of  General Education information/materials will improve Outcome: Progressing Patient oriented to unit and hospital. Discussed pain rating scale and how patient can use call light to reach [place] for assistance. Patient's husband at bedside- discussed plan of care with pt and husband.   Problem: Safety: Goal: Ability to remain free from injury will improve Outcome: Completed/Met Date Met: 11/17/16 Patient recovering from broken ankle- using boot to ambulate from room to bathroom. Patient does not display impulsive behavior- calls for assistance at all times.   Problem: Health Behavior/Discharge Planning: Goal: Ability to manage health-related needs will improve Outcome: Completed/Met Date Met: 11/17/16 Patient denies need for discharge resources at this time. Patient has good support system and is able to function independently with some help from spouse.   Problem: Pain Managment: Goal: General experience of comfort will improve Outcome: Progressing Patient requested pain medicine twice on night shift. Patient given tylenol for a headache and 1 Norco pill for chest pain at end of shift. Patient states she only has chest pain when she takes deep breaths in. Patient did not want dilaudid as it made her " feel weird" yesterday.   Problem: Physical Regulation: Goal: Ability to maintain clinical measurements within normal limits will improve Outcome: Progressing Patient with some HTN overnight. Troponin trending down. Continuing to monitor patient for changes to condition in presence of PE.  Problem: Tissue Perfusion: Goal: Risk factors for ineffective tissue perfusion will decrease Outcome: Progressing Patient on Heparin gtt for PE.   Problem: Activity: Goal: Risk for activity intolerance will decrease Outcome: Progressing Patient ambulates to bathroom with minimal standby assist. Wearing walking boot for broken ankle.   Problem:  Bowel/Gastric: Goal: Will not experience complications related to bowel motility Outcome: Progressing Patient's last bowel movement was 9/29- patient has PRN dulcolax if needed.

## 2016-11-17 NOTE — Progress Notes (Signed)
PROGRESS NOTE    Samantha [name]  ZOX:[id number] DOB: July 31, 1980 DOA: 11/16/2016 PCP: Wilfrid Lund, PA    Brief Narrative: Samantha [name] a 36 y.o.femalewith medical history significant for anxiety, now presenting to the emergency department for evaluation of chest pain and exertional dyspnea. She was found to have acute PE, started on IV heparin and admitted . Echocardiogram and LE duplex ordered.    Assessment & Plan:   Principal Problem:   Acute pulmonary embolism (HCC) Active Problems:   Elevated troponin   Anxiety   Hyponatremia   Right ankle sprain   PE (pulmonary thromboembolism) (HCC)   Acute pulmonary embolism: provoked event. In addition to using the birth control pills.  Admitted to stepdown, started on IV heparin gtt.  PCCm consulted on admission and recommendations given.  Echocardiogram and venous duplex of the lower extremities ordered.  Pain control.  Hypercoagulable work up is pending.    Elevated troponins from the demand ischemia from PE.   Leukocytosis :  Resolved, possibly reactive.   Hyponatremia: resolved.       DVT prophylaxis: heparin gtt. Code Status: full code.  Family Communication: family at bedside.  Disposition Plan: possibly home in am.    Consultants:   PCCM.   Procedures: CT angiogram of the chest.  Echocardiogram.  Venous duplex of the lower extremities.   Antimicrobials: none.   Subjective: Some chest discomfort earlier today, resolved.   Objective: Vitals:   11/17/16 0600 11/17/16 0800 11/17/16 0807 11/17/16 1153  BP: (!) 149/88 (!) 141/81  (!) 157/88  Pulse: 89 75    Resp: Temp:  98.1 F (36.7 C) 98.1 F (36.7 C) 99.3 F (37.4 C)  TempSrc:  Oral Oral Oral  SpO2: 100% 99%    Weight:      Height:        Intake/Output Summary (Last 24 hours) at 11/17/16 1716 Last data filed at 11/17/16 0700  Gross per 24 hour  Intake            522.5 ml  Output                0 ml    Net            522.5 ml   Filed Weights   11/15/16 2346 11/16/16 1854 11/17/16 0358  Weight: 104.3 kg (230 lb) 107.5 kg (237 lb) 108.3 kg (238 lb 11.2 oz)    Examination:  General exam: Appears calm and comfortable  Respiratory system: Clear to auscultation. Respiratory effort normal. Cardiovascular system: S1 & S2 heard, RRR. No JVD, murmurs, rubs, gallops or clicks. No pedal edema. Gastrointestinal system: Abdomen is nondistended, soft and nontender. No organomegaly or masses felt. Normal bowel sounds heard. Central nervous system: Alert and oriented. No focal neurological deficits. Extremities: Symmetric 5 x 5 power. Skin: No rashes, lesions or ulcers Psychiatry: Judgement and insight appear normal. Mood & affect appropriate.     Data Reviewed: I have personally reviewed following labs and imaging studies  CBC:  Recent Labs Lab 11/15/16 2354 11/16/16 0515 11/17/16 0329  WBC 14.4* 13.9* 9.7  HGB 13.2 12.5 12.1  HCT 38.9 37.3 36.0  MCV 88.0 88.6 89.8  PLT 325 272 242   Basic Metabolic Panel:  Recent Labs Lab 11/15/16 2354 11/16/16 0434  NA 131* 137  K 3.7 4.0  CL 105 108  CO2 18* 18*  GLUCOSE 117* 130*  BUN 13 13  CREATININE 0.95  0.77  CALCIUM 8.6* 8.2*   GFR: Estimated Creatinine Clearance: 124.2 mL/min (by C-G formula based on SCr of 0.77 mg/dL). Liver Function Tests: No results for input(s): AST, ALT, ALKPHOS, BILITOT, PROT, ALBUMIN in the last 168 hours. No results for input(s): LIPASE, AMYLASE in the last 168 hours. No results for input(s): AMMONIA in the last 168 hours. Coagulation Profile:  Recent Labs Lab 11/15/16 2354  INR 0.93   Cardiac Enzymes:  Recent Labs Lab 11/15/16 2354 11/16/16 0434 11/16/16 1134 11/16/16 1719  TROPONINI 0.67* 0.37* 0.23* 0.16*   BNP (last 3 results) No results for input(s): PROBNP in the last 8760 hours. HbA1C: No results for input(s): HGBA1C in the last 72 hours. CBG: No results for input(s): GLUCAP  in the last 168 hours. Lipid Profile: No results for input(s): CHOL, HDL, LDLCALC, TRIG, CHOLHDL, LDLDIRECT in the last 72 hours. Thyroid Function Tests: No results for input(s): TSH, T4TOTAL, FREET4, T3FREE, THYROIDAB in the last 72 hours. Anemia Panel: No results for input(s): VITAMINB12, FOLATE, FERRITIN, TIBC, IRON, RETICCTPCT in the last 72 hours. Sepsis Labs: No results for input(s): PROCALCITON, LATICACIDVEN in the last 168 hours.  Recent Results (from the past 240 hour(s))  MRSA PCR Screening     Status: None   Collection Time: 11/16/16  7:09 PM  Result Value Ref Range Status   MRSA by PCR NEGATIVE NEGATIVE Final    Comment:        The GeneXpert MRSA Assay (FDA approved for NASAL specimens only), is one component of a comprehensive MRSA colonization surveillance program. It is not intended to diagnose MRSA infection nor to guide or monitor treatment for MRSA infections.          Radiology Studies: Ct Angio Chest Pe W Or Wo Contrast  Result Date: 11/16/2016 CLINICAL DATA:  36 y/o F; 36 y/o F; left-sided chest pain radiating to the back. EXAM: CT ANGIOGRAPHY CHEST WITH CONTRAST TECHNIQUE: Multidetector CT imaging of the chest was performed using the standard protocol during bolus administration of intravenous contrast. Multiplanar CT image reconstructions and MIPs were obtained to evaluate the vascular anatomy. CONTRAST:  61 cc Isovue 370 COMPARISON:  None. FINDINGS: Cardiovascular: Extensive bilateral pulmonary embolus, no saddle embolus. RV/ LV equals 1.5. Normal heart size. No pericardial effusion. Normal thoracic aorta. Mediastinum/Nodes: No enlarged mediastinal, hilar, or axillary lymph nodes. Thyroid gland, trachea, and esophagus demonstrate no significant findings. Lungs/Pleura: Lungs are clear. No pleural effusion or pneumothorax. Upper Abdomen: No acute abnormality. Musculoskeletal: No chest wall abnormality. No acute or significant osseous findings. Review of the  MIP images confirms the above findings. IMPRESSION: Positive for acute PE with CT evidence of right heart strain (RV/LV Ratio = 1.5) consistent with at least submassive (intermediate risk) PE. The presence of right heart strain has been associated with an increased risk of morbidity and mortality. Please activate Code PE by paging [phone]. These results were called by telephone at the time of interpretation on 11/16/2016 at 3:26 am to Dr. Jaci Carrel , who verbally acknowledged these results. Electronically Signed   By: Mitzi Hansen M.D.   On: 11/16/2016 03:27        Scheduled Meds: . escitalopram  20 mg Oral QPM  . multivitamin with minerals  1 tablet Oral Daily  . sodium chloride flush  3 mL Intravenous Q12H  . traZODone  50 mg Oral QHS   Continuous Infusions: . heparin 1,600 Units/hr (11/17/16 1616)     LOS: 1 day    Time spent:  35 minutes.     Kathlen Mody, MD [facility] Pager [phone]  If 7PM-7AM, please contact night-coverage [email or web address] Password [email or web address] 11/17/2016, 5:16 PM

## 2016-11-17 NOTE — Progress Notes (Signed)
Name: Samantha [name] MRN: [id number] DOB: 04-Dec-1980    ADMISSION DATE:  11/16/2016 CONSULTATION DATE:  11/16/2016  REFERRING MD :  [facility] /Dr. Opyd   CHIEF COMPLAINT:  PE    BRIEF PATIENT DESCRIPTION:  36 yo female never smoker on birth control with recent right ankle injury, family history of VTE , mother had a PE in the postpartum period.    Presented to ER 11/16/16 with dyspnea. Found to have an acute PE on CT chest . Appears to be provoked with Right ankle injury/boot immobilization on BCP . There is Right heart strain on CT chest . Pulmonary consulted to evaluate for EKOS.   SIGNIFICANT EVENTS  Admitted 9/30 with Acute bilateral PE   STUDIES:  CTA Chest 11/16/16 >extensive bilateral PE , Ratio 1.5 . +evidence of RHS  ECHO 10/1 >> LE Dopplers 10/1 >>    SUBJECTIVE:  No events overnight. Remains on room air and heparin gtt.   VITAL SIGNS: Temp:  [98 F (36.7 C)-98.3 F (36.8 C)] 98.1 F (36.7 C) (10/01 0807) Pulse Rate:  [75-106] 75 (10/01 0800) Resp:  [11-25] 16 (10/01 0800) BP: (101-169)/(64-97) 141/81 (10/01 0800) SpO2:  [96 %-100 %] 99 % (10/01 0800) Weight:  [107.5 kg (237 lb)-108.3 kg (238 lb 11.2 oz)] 108.3 kg (238 lb 11.2 oz) (10/01 0358)  PHYSICAL EXAMINATION:  GEN: Adult female, no distress  HEENT:  Normocephalic  RESP  Non-labored, clear breath sounds  CARD:  RRR, No MRG  GI:  Active bowel sounds, non-tender  Musco: BLE edema +2 Neuro: Alert, oriented, follows commands    Skin: Warm, no lesions or rashes    Recent Labs Lab 11/15/16 2354 11/16/16 0434  NA 131* 137  K 3.7 4.0  CL 105 108  CO2 18* 18*  BUN 13 13  CREATININE 0.95 0.77  GLUCOSE 117* 130*    Recent Labs Lab 11/15/16 2354 11/16/16 0515 11/17/16 0329  HGB 13.2 12.5 12.1  HCT 38.9 37.3 36.0  WBC 14.4* 13.9* 9.7  PLT 325 272 242   Ct Angio Chest Pe W Or Wo Contrast  Result Date: 11/16/2016 CLINICAL DATA:  36 y/o F; 36 y/o F; left-sided chest pain radiating to the  back. EXAM: CT ANGIOGRAPHY CHEST WITH CONTRAST TECHNIQUE: Multidetector CT imaging of the chest was performed using the standard protocol during bolus administration of intravenous contrast. Multiplanar CT image reconstructions and MIPs were obtained to evaluate the vascular anatomy. CONTRAST:  61 cc Isovue 370 COMPARISON:  None. FINDINGS: Cardiovascular: Extensive bilateral pulmonary embolus, no saddle embolus. RV/ LV equals 1.5. Normal heart size. No pericardial effusion. Normal thoracic aorta. Mediastinum/Nodes: No enlarged mediastinal, hilar, or axillary lymph nodes. Thyroid gland, trachea, and esophagus demonstrate no significant findings. Lungs/Pleura: Lungs are clear. No pleural effusion or pneumothorax. Upper Abdomen: No acute abnormality. Musculoskeletal: No chest wall abnormality. No acute or significant osseous findings. Review of the MIP images confirms the above findings. IMPRESSION: Positive for acute PE with CT evidence of right heart strain (RV/LV Ratio = 1.5) consistent with at least submassive (intermediate risk) PE. The presence of right heart strain has been associated with an increased risk of morbidity and mortality. Please activate Code PE by paging [phone]. These results were called by telephone at the time of interpretation on 11/16/2016 at 3:26 am to Dr. Jaci Carrel , who verbally acknowledged these results. Electronically Signed   By: Mitzi Hansen M.D.   On: 11/16/2016 03:27    ASSESSMENT / PLAN:  Bilateral  Acute PE  CT chest shows Right heart strain with 1.5 ratio. There is moderate clot burden.   Plan  Continue Heparin gtt  2 D echo and Venous doppler pending  Discussed with patient need for anticoagulation for next 3-6 months  No indication for EKOS > patient remains on room air and HD stable   PCCM sign off. Please call back if needed.    Jovita Kussmaul, AGACNP-BC [facility]  Pgr: [phone]  PCCM Pgr:  [phone]   STAFF NOTE: Cindi Carbon, MD FACP have personally reviewed patient's available data, including medical history, events of note, physical examination and test results as part of my evaluation. I have discussed with resident/NP and other care providers such as pharmacist, RN and RRT. 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, agree no EKOS needed, await echo to assess rv fxn and PA pressures, also await doppler lower ext, I am concerned she had or has still source of dvt rt lower ext, need to ensure no mobile clot given current burden, maintain heparin drip and add oral agent likely this pm okay after we ensure no mobile thrombus that wold need additional filter etc, [place] [address] gene mutation (she is from [place] descent) and NOW  And consider at3, prot c, s def as outpt in 4 weeks after clot burden lower and off heparin, I counseled her no more Birth control pills, consider standing position at work in future and likely eneds about 3-6 months anticoagulation where I favor 6 months with family history   Mcarthur Rossetti. Tyson Alias, MD, FACP Pgr: [phone] [facility] 11/17/2016 1:10 PM

## 2016-11-17 NOTE — Progress Notes (Signed)
ANTICOAGULATION CONSULT NOTE - Follow Up Consult  Pharmacy Consult for Heparin Indication: pulmonary embolus  No Known Allergies  Patient Measurements: Height:  (172.7 cm) Weight: 238 lb 11.2 oz (108.3 kg) IBW/kg (Calculated) : 63.9 Heparin Dosing Weight: 87.2 kg  Assessment: 36 year old female on IV heparin for PE. No anticoagulation PTA. Heparin level today now borderline low.  Goal of Therapy:  Heparin level 0.3-0.7 units/ml Monitor platelets by anticoagulation protocol: Yes   Plan:  Increase heparin gtt to 1600 units/hr. Monitor daily heparin level, CBC, s/s of bleed  Follow long term anticoagulation plans  Enzo Bi, PharmD, [facility] Clinical Pharmacist Pager [phone] 11/17/2016 4:04 PM

## 2016-11-17 NOTE — Progress Notes (Signed)
  Echocardiogram 2D Echocardiogram has been performed.  Samantha [name] 11/17/2016, 11:32 AM

## 2016-11-17 NOTE — Progress Notes (Signed)
8. S/W MARIA  @ [facility] # [phone]   1. XARELTO 10 MG BID  COVER- YES  CO-PAY- $35.00  TIER- 2 DRUG  PRIOR APPROVAL- NO   2. XARELTO 20 MG DAILY  COVER- YES  CO-PAY- $ 35.00  TIER- 2 DRUG  PRIOR APPROVAL- NO   3. ELIQUIS 2.5 MG BID  COVER- YES  CO-PAY- $ 50.00  TIER- 3 DRUG'  PRIOR APPROVAL- NO   4. ELIQUIS  5 MG BID  COVER- YES  CO-PAY- $ 50.00  TIER- 3 DRUG  PRIOR APPROVAL- NO    [facility] : [facility] AND [facility]

## 2016-11-17 NOTE — Progress Notes (Signed)
Test to Dr Blake Divine that RN does not see where 2 D Echo is ordered.  Waiting response.

## 2016-11-18 ENCOUNTER — Inpatient Hospital Stay (HOSPITAL_COMMUNITY): Payer: 59

## 2016-11-18 DIAGNOSIS — I2699 Other pulmonary embolism without acute cor pulmonale: Secondary | ICD-10-CM

## 2016-11-18 LAB — CBC
HCT: 37.4 % (ref 36.0–46.0)
HEMOGLOBIN: 12.5 g/dL (ref 12.0–15.0)
MCH: 29.9 pg (ref 26.0–34.0)
MCHC: 33.4 g/dL (ref 30.0–36.0)
MCV: 89.5 fL (ref 78.0–100.0)
PLATELETS: 255 10*3/uL (ref 150–400)
RBC: 4.18 MIL/uL (ref 3.87–5.11)
RDW: 12.6 % (ref 11.5–15.5)
WBC: 8.6 10*3/uL (ref 4.0–10.5)

## 2016-11-18 LAB — HEPARIN LEVEL (UNFRACTIONATED)
HEPARIN UNFRACTIONATED: 0.41 [IU]/mL (ref 0.30–0.70)
Heparin Unfractionated: 0.4 IU/mL (ref 0.30–0.70)

## 2016-11-18 MED ORDER — HYDROCODONE-ACETAMINOPHEN 5-325 MG PO TABS: 1.0000 | ORAL_TABLET | Freq: Three times a day (TID) | ORAL | 0 refills | Status: DC | PRN

## 2016-11-18 MED ORDER — RIVAROXABAN 20 MG PO TABS: 20.0000 mg | ORAL_TABLET | Freq: Every day | ORAL | 1 refills | Status: DC

## 2016-11-18 MED ORDER — RIVAROXABAN 15 MG PO TABS
15.0000 mg | ORAL_TABLET | Freq: Two times a day (BID) | ORAL | Status: DC
  Administered 2016-11-18: 15 mg via ORAL
  Filled 2016-11-18: qty 1

## 2016-11-18 MED ORDER — SENNOSIDES-DOCUSATE SODIUM 8.6-50 MG PO TABS: 1.0000 | ORAL_TABLET | Freq: Every evening | ORAL | 0 refills | Status: DC | PRN

## 2016-11-18 MED ORDER — RIVAROXABAN 15 MG PO TABS: 15.0000 mg | ORAL_TABLET | Freq: Two times a day (BID) | ORAL | 0 refills | Status: DC

## 2016-11-18 MED ORDER — RIVAROXABAN (XARELTO) EDUCATION KIT FOR DVT/PE PATIENTS
PACK | Freq: Once | Status: AC
  Administered 2016-11-18: 18:00:00
  Filled 2016-11-18: qty 1

## 2016-11-18 MED ORDER — RIVAROXABAN 20 MG PO TABS: 20.0000 mg | ORAL_TABLET | Freq: Every day | ORAL | Status: DC

## 2016-11-18 NOTE — Plan of Care (Signed)
Problem: Physical Regulation: Goal: Ability to maintain clinical measurements within normal limits will improve Patient able to teach back understanding  the importance of reporting any shortness of breath or uncontrollable bleeding on discharge.

## 2016-11-18 NOTE — Plan of Care (Signed)
Problem: Bowel/Gastric: Goal: Will not experience complications related to bowel motility Outcome: Adequate for Discharge Patient given RX for Senekot for assistance with any bowel problem on discharge.

## 2016-11-18 NOTE — Plan of Care (Signed)
Problem: Bowel/Gastric: Goal: Will not experience complications related to bowel motility Patient stated understand for plan with given RX for Senekot to help avoid problem with bowels.

## 2016-11-18 NOTE — Progress Notes (Signed)
Preliminary results by tech - Venous Duplex Lower Ext. Completed. Positive for acute deep vein thrombosis involving the right peroneal veins only. All other veins are patent without evidence of deep vein thrombosis or superficial vein thrombosis. Results given to [facility], patient's nurse. Marilynne Halsted, BS, RDMS, RVT

## 2016-11-18 NOTE — Care Management Note (Signed)
Case Management Note  Patient Details  Name: Samantha [name] MRN: [id number] Date of Birth: 1980/10/08  Subjective/Objective:   From home, presents with PE,/DVT, on heparin drip, NCM facilitated benefit check for xarelto and eliquis. MD decided xarelto med of choice and discharged patient in late evening.  Co pay for xarelto was 35.00.                  Action/Plan: NCM will follow for dc needs.   Expected Discharge Date:  11/18/16               Expected Discharge Plan:  Home/Self Care  In-House Referral:     Discharge planning Services  CM Consult  Post Acute Care Choice:    Choice offered to:     DME Arranged:    DME Agency:     HH Arranged:    HH Agency:     Status of Service:  Completed, signed off  If discussed at [employer] of Stay Meetings, dates discussed:    Additional Comments:  Leone Haven, RN 11/18/2016, 10:37 PM

## 2016-11-18 NOTE — Plan of Care (Signed)
Problem: Education: Goal: Knowledge of [facility] Education information/materials will improve Outcome: Adequate for Discharge Reviewed in detail handout Samantha [name] to patient and she took home with her.  Patient and husband verberalized understanding to increase dose from  to  on 10/24 as written in the discharge instructions.  Norco RX given to patient and [facility].

## 2016-11-18 NOTE — Progress Notes (Signed)
Message sent Dr Blake Divine to clarify why VS [place] of BILATERAL legs ordered..only left leg done in ED.  Patient did have a fall.  Dr Blake Divine immediately returned call and did want study performed.

## 2016-11-18 NOTE — Progress Notes (Signed)
ANTICOAGULATION CONSULT NOTE   Pharmacy Consult for Heparin Indication: pulmonary embolus  No Known Allergies  Patient Measurements: Height:  (172.7 cm) Weight: 238 lb 11.2 oz (108.3 kg) IBW/kg (Calculated) : 63.9 Heparin Dosing Weight: 87.2 kg  Vital Signs: Temp: 98.2 F (36.8 C) (10/01 1915) Temp Source: Oral (10/01 1915) BP: 138/83 (10/01 2000) Pulse Rate: 78 (10/01 2000)  Labs:  Recent Labs  11/15/16 2354 11/16/16 0434 11/16/16 0515  11/16/16 1134 11/16/16 1719 11/17/16 0329 11/17/16 1253 11/17/16 2342  HGB 13.2  --  12.5  --   --   --  12.1  --   --   HCT 38.9  --  37.3  --   --   --  36.0  --   --   PLT 325  --  272  --   --   --  242  --   --   APTT 28  --   --   --   --   --   --   --   --   LABPROT 12.3  --   --   --   --   --   --   --   --   INR 0.93  --   --   --   --   --   --   --   --   HEPARINUNFRC  --   --   --   < > 0.56 0.48  --  0.28* 0.40  CREATININE 0.95 0.77  --   --   --   --   --   --   --   TROPONINI 0.67* 0.37*  --   --  0.23* 0.16*  --   --   --   < > = values in this interval not displayed.  Estimated Creatinine Clearance: 124.2 mL/min (by C-G formula based on SCr of 0.77 mg/dL).   Assessment: 36 year old female with PE for heparin  Goal of Therapy:  Heparin level 0.3-0.7 units/ml Monitor platelets by anticoagulation protocol: Yes   Plan:  Continue Heparin at current rate   Geannie Risen, PharmD, BCPS  11/18/2016 12:46 AM

## 2016-11-18 NOTE — Progress Notes (Signed)
ANTICOAGULATION CONSULT NOTE   Pharmacy Consult for Xarelto Indication: pulmonary embolus  No Known Allergies  Patient Measurements: Height:  (172.7 cm) Weight: 238 lb 15.7 oz (108.4 kg) IBW/kg (Calculated) : 63.9 Heparin Dosing Weight: 87.2 kg  Assessment: 36 year old female on heparin gtt for acute PE. No AC PTA but is on birth control. Pharmacy now consulted to switch from IV heparin to Xarelto. H/H and Plt wnl   Goal of Therapy:  Heparin level 0.3-0.7 units/ml Monitor platelets by anticoagulation protocol: Yes   Plan:  Start Xarelto 15 mg twice daily x 21 days, then Xarelto 20 mg daily  Stop IV heparin 1 hour after first Xarelto dose tonight  Monitor renal fx and s/s of bleeding  Provide Xarelto education prior to discharge   Vinnie Level, PharmD., BCPS Clinical Pharmacist Pager [phone]

## 2016-11-18 NOTE — Plan of Care (Signed)
Problem: Activity: Goal: Risk for activity intolerance will decrease Outcome: Adequate for Discharge Patient stated she would call and make appointment with primary Dr to follow up and find out her return to work date.

## 2016-11-18 NOTE — Progress Notes (Signed)
Message sent Dr Blake Divine with patient's headache and chest pain with no Leg VS [place].

## 2016-11-18 NOTE — Progress Notes (Signed)
ANTICOAGULATION CONSULT NOTE   Pharmacy Consult for Heparin Indication: pulmonary embolus  No Known Allergies  Patient Measurements: Height:  (172.7 cm) Weight: 238 lb 15.7 oz (108.4 kg) IBW/kg (Calculated) : 63.9 Heparin Dosing Weight: 87.2 kg  Assessment: 36 year old female on heparin gtt for acute PE. No AC PTA but is on birth control. Heparin level is stable this am at 0.41. CBC stable.   Goal of Therapy:  Heparin level 0.3-0.7 units/ml Monitor platelets by anticoagulation protocol: Yes   Plan:  Continue heparin drip at 1,600 units/hr Monitor daily heparin level, CBC, s/s of bleed F/U transition to PO agent  Enzo Bi, PharmD, BCPS Clinical Pharmacist Pager [phone] 11/18/2016 7:29 AM

## 2016-11-19 LAB — PROTHROMBIN GENE MUTATION

## 2016-11-19 LAB — PROTEIN C, TOTAL: PROTEIN C, TOTAL: 138 % (ref 60–150)

## 2016-11-19 LAB — BETA-2-GLYCOPROTEIN I ABS, IGG/M/A

## 2016-11-19 LAB — CARDIOLIPIN ANTIBODIES, IGG, IGM, IGA
Anticardiolipin IgG: <9 GPL U/mL (ref 0–14)
Anticardiolipin IgM: <9 MPL U/mL (ref 0–12)

## 2016-11-19 LAB — FACTOR 5 LEIDEN

## 2016-11-19 NOTE — Discharge Summary (Signed)
Physician Discharge Summary  Samantha [name]:[id number] DOB: 04-11-1980 DOA: 11/16/2016  PCP: Wilfrid Lund, PA  Admit date: 11/16/2016 Discharge date: 11/18/2016  Admitted From: Home.  Disposition:  Home.   Recommendations for Outpatient Follow-up:  1. Follow up with PCP in 1-2 weeks 2. Please obtain BMP/CBC in one week Please follow up with pulmonology as recommended.   Discharge Condition:stable.  CODE STATUS: full code.  Diet recommendation: Heart Healthy Brief/Interim Summary:  Samantha [name] a 36 y.o.femalewith medical history significant for anxiety, now presenting to the emergency department for evaluation of chest pain and exertional dyspnea. She was found to have acute PE, started on IV heparin and admitted . Echocardiogram and LE duplex ordered.  LE duplex shows  Positive for acute deep vein thrombosis involving the right peroneal veins only. Echocardiogram shows normall LV systolic and diastolic function, RV is mod dilated and mod reduced systolic function. Discussed with pulmonology . Outpatient followup with pulmonology scheduled.  She was transitioned to xarelto on discharge.    Discharge Diagnoses:  Principal Problem:   Acute pulmonary embolism (HCC) Active Problems:   Elevated troponin   Anxiety   Hyponatremia   Right ankle sprain   PE (pulmonary thromboembolism) (HCC)   Acute pulmonary embolism: provoked event. In addition to using the birth control pills.  Admitted to stepdown, started on IV heparin gtt.  PCCm consulted on admission and recommendations given.  Echocardiogram and venous duplex of the lower extremities ordered and reviewed,. Pain control with vicodin.  Hypercoagulable work up is pending and recommend outpatient followup with pulmonology as scheduled.  Her IV heparin was transitioned to oral xarelto.    Elevated troponins from the demand ischemia from PE. Currently denies chest pain.   Leukocytosis :   Resolved, possibly reactive.   Hyponatremia: resolved.   right ankle sprain, pain control.    Discharge Instructions  Discharge Instructions    Diet - low sodium heart healthy    Complete by:  As directed    Discharge instructions    Complete by:  As directed    Please follow up with pulmonology in 1 to 2 weeks as recommended.     Allergies as of 11/18/2016   No Known Allergies     Medication List    TAKE these medications   AMBULATORY NON FORMULARY MEDICATION Nitroglycerine ointment 0.125 %  Apply a pea sized amount internally four times daily. Dispense 30 GM zero refill What changed:  how much to take  how to take this  when to take this  reasons to take this  additional instructions   diphenhydrAMINE 25 MG tablet Commonly known as:  BENADRYL Take 25 mg by mouth at bedtime.   HYDROcodone-acetaminophen 5-325 MG tablet Commonly known as:  NORCO/VICODIN Take 1 tablet by mouth every 8 (eight) hours as needed for moderate pain.   LEXAPRO 20 MG tablet Generic drug:  escitalopram Take 20 mg by mouth daily.   lidocaine 5 % ointment Commonly known as:  XYLOCAINE Apply 1 application topically 2 (two) times daily as needed for mild pain.   Melatonin 10 MG Tabs Take 10 mg by mouth at bedtime.   multivitamin with minerals tablet Take 1 tablet by mouth daily.   ORTHO TRI-CYCLEN LO 0.18/0.215/0.25 MG-25 MCG tab Generic drug:  Norgestimate-Ethinyl Estradiol Triphasic Take 1 tablet by mouth daily.   polyethylene glycol packet Commonly known as:  MIRALAX / GLYCOLAX Take 17 g by mouth daily as needed for mild constipation.  Rivaroxaban 15 MG Tabs tablet Commonly known as:  XARELTO Take 1 tablet (15 mg total) by mouth 2 (two) times daily with a meal.   rivaroxaban 20 MG Tabs tablet Commonly known as:  XARELTO Take 1 tablet (20 mg total) by mouth daily with supper. Start taking on:  12/10/2016   senna-docusate 8.6-50 MG tablet Commonly known as:   Senokot-S Take 1 tablet by mouth at bedtime as needed for mild constipation.   traZODone 50 MG tablet Commonly known as:  DESYREL Take 25 mg by mouth at bedtime.      Follow-up Information    [facility]. Call on 12/04/2016.   Specialty:  Pulmonology Why:  10/18 at 3:15pm Contact information: [address] [place] [address] [phone]       Wilfrid Lund, [place]. Schedule an appointment as soon as possible for a visit in 1 week(s).   Specialty:  Family Medicine Contact information: [address] [place] [address] [phone]          No Known Allergies  Consultations:  PCCM   Procedures/Studies: Dg Ankle Complete Right  Result Date: 10/30/2016 CLINICAL DATA:  Right ankle inversion injury. EXAM: RIGHT ANKLE - COMPLETE 3+ VIEW COMPARISON:  No recent prior. FINDINGS: No acute bony or joint abnormality identified. No evidence of fracture or dislocation. IMPRESSION: No acute abnormality . Electronically Signed   By: Maisie Fus  Register   On: 10/30/2016 11:55   Ct Angio Chest Pe W Or Wo Contrast  Result Date: 11/16/2016 CLINICAL DATA:  36 y/o F; 36 y/o F; left-sided chest pain radiating to the back. EXAM: CT ANGIOGRAPHY CHEST WITH CONTRAST TECHNIQUE: Multidetector CT imaging of the chest was performed using the standard protocol during bolus administration of intravenous contrast. Multiplanar CT image reconstructions and MIPs were obtained to evaluate the vascular anatomy. CONTRAST:  61 cc Isovue 370 COMPARISON:  None. FINDINGS: Cardiovascular: Extensive bilateral pulmonary embolus, no saddle embolus. RV/ LV equals 1.5. Normal heart size. No pericardial effusion. Normal thoracic aorta. Mediastinum/Nodes: No enlarged mediastinal, hilar, or axillary lymph nodes. Thyroid gland, trachea, and esophagus demonstrate no significant findings. Lungs/Pleura: Lungs are clear. No pleural effusion or pneumothorax. Upper Abdomen: No acute abnormality.  Musculoskeletal: No chest wall abnormality. No acute or significant osseous findings. Review of the MIP images confirms the above findings. IMPRESSION: Positive for acute PE with CT evidence of right heart strain (RV/LV Ratio = 1.5) consistent with at least submassive (intermediate risk) PE. The presence of right heart strain has been associated with an increased risk of morbidity and mortality. Please activate Code PE by paging [phone]. These results were called by telephone at the time of interpretation on 11/16/2016 at 3:26 am to Dr. Jaci Carrel , who verbally acknowledged these results. Electronically Signed   By: Mitzi Hansen M.D.   On: 11/16/2016 03:27   Dg Foot Complete Left  Result Date: 10/30/2016 CLINICAL DATA:  Larey Seat today with pain mid first metatarsal EXAM: LEFT FOOT - COMPLETE 3+ VIEW COMPARISON:  None. FINDINGS: Tarsal-metatarsal alignment is normal. No acute fracture is seen. No erosion is noted. IMPRESSION: Negative. Electronically Signed   By: Dwyane Dee M.D.   On: 10/30/2016 11:56       Subjective: No chest pai n or sob. No palpitations.   Discharge Exam: Vitals:   11/18/16 1252 11/18/16 1657  BP: 137/85 (!) 143/78  Pulse:    Resp: 13 13  Temp: 98.1 F (36.7 C) 98.4 F (36.9 C)  SpO2: 100% 99%   Vitals:   11/18/16 0604 11/18/16 0721 11/18/16 1252 11/18/16 1657  BP:  (!) 153/95 137/85 (!) 143/78  Pulse:  80    Resp:  Temp:  98.4 F (36.9 C) 98.1 F (36.7 C) 98.4 F (36.9 C)  TempSrc:  Oral Oral Oral  SpO2:  100% 100% 99%  Weight: 108.4 kg (238 lb 15.7 oz)     Height:        General: Pt is alert, awake, not in acute distress Cardiovascular: RRR, S1/S2 +, no rubs, no gallops Respiratory: CTA bilaterally, no wheezing, no rhonchi Abdominal: Soft, NT, ND, bowel sounds + Extremities: no edema, no cyanosis    The results of significant diagnostics from this hospitalization (including imaging, microbiology, ancillary and  laboratory) are listed below for reference.     Microbiology: Recent Results (from the past 240 hour(s))  MRSA PCR Screening     Status: None   Collection Time: 11/16/16  7:09 PM  Result Value Ref Range Status   MRSA by PCR NEGATIVE NEGATIVE Final    Comment:        The GeneXpert MRSA Assay (FDA approved for NASAL specimens only), is one component of a comprehensive MRSA colonization surveillance program. It is not intended to diagnose MRSA infection nor to guide or monitor treatment for MRSA infections.      Labs: BNP (last 3 results)  Recent Labs  11/15/16 2354  BNP 145.7*   Basic Metabolic Panel:  Recent Labs Lab 11/15/16 2354 11/16/16 0434  NA 131* 137  K 3.7 4.0  CL 105 108  CO2 18* 18*  GLUCOSE 117* 130*  BUN 13 13  CREATININE 0.95 0.77  CALCIUM 8.6* 8.2*   Liver Function Tests: No results for input(s): AST, ALT, ALKPHOS, BILITOT, PROT, ALBUMIN in the last 168 hours. No results for input(s): LIPASE, AMYLASE in the last 168 hours. No results for input(s): AMMONIA in the last 168 hours. CBC:  Recent Labs Lab 11/15/16 2354 11/16/16 0515 11/17/16 0329 11/18/16 0317  WBC 14.4* 13.9* 9.7 8.6  HGB 13.2 12.5 12.1 12.5  HCT 38.9 37.3 36.0 37.4  MCV 88.0 88.6 89.8 89.5  PLT 325 272 242 255   Cardiac Enzymes:  Recent Labs Lab 11/15/16 2354 11/16/16 0434 11/16/16 1134 11/16/16 1719  TROPONINI 0.67* 0.37* 0.23* 0.16*   BNP: Invalid input(s): POCBNP CBG: No results for input(s): GLUCAP in the last 168 hours. D-Dimer No results for input(s): DDIMER in the last 72 hours. Hgb A1c No results for input(s): HGBA1C in the last 72 hours. Lipid Profile No results for input(s): CHOL, HDL, LDLCALC, TRIG, CHOLHDL, LDLDIRECT in the last 72 hours. Thyroid function studies No results for input(s): TSH, T4TOTAL, T3FREE, THYROIDAB in the last 72 hours.  Invalid input(s): FREET3 Anemia work up No results for input(s): VITAMINB12, FOLATE, FERRITIN, TIBC,  IRON, RETICCTPCT in the last 72 hours. Urinalysis No results found for: COLORURINE, APPEARANCEUR, LABSPEC, PHURINE, GLUCOSEU, HGBUR, BILIRUBINUR, KETONESUR, PROTEINUR, UROBILINOGEN, NITRITE, LEUKOCYTESUR Sepsis Labs Invalid input(s): PROCALCITONIN,  WBC,  LACTICIDVEN Microbiology Recent Results (from the past 240 hour(s))  MRSA PCR Screening     Status: None   Collection Time: 11/16/16  7:09 PM  Result Value Ref Range Status   MRSA by PCR NEGATIVE NEGATIVE Final    Comment:        The GeneXpert MRSA Assay (FDA approved for NASAL specimens only), is one component of a comprehensive MRSA colonization surveillance program. It is not  intended to diagnose MRSA infection nor to guide or monitor treatment for MRSA infections.      Time coordinating discharge: Over 30 minutes  SIGNED:   Kathlen Mody, MD  [facility] 11/19/2016, 9:35 AM Pager   If 7PM-7AM, please contact night-coverage [email or web address] Password [email or web address]

## 2016-11-24 DIAGNOSIS — S82391D Other fracture of lower end of right tibia, subsequent encounter for closed fracture with routine healing: Secondary | ICD-10-CM | POA: Diagnosis not present

## 2016-11-25 DIAGNOSIS — I2699 Other pulmonary embolism without acute cor pulmonale: Secondary | ICD-10-CM | POA: Diagnosis not present

## 2016-12-04 ENCOUNTER — Encounter: Payer: Self-pay | Admitting: Adult Health

## 2016-12-04 ENCOUNTER — Ambulatory Visit (INDEPENDENT_AMBULATORY_CARE_PROVIDER_SITE_OTHER): Payer: 59 | Admitting: Adult Health

## 2016-12-04 DIAGNOSIS — I824Z1 Acute embolism and thrombosis of unspecified deep veins of right distal lower extremity: Secondary | ICD-10-CM

## 2016-12-04 DIAGNOSIS — I82409 Acute embolism and thrombosis of unspecified deep veins of unspecified lower extremity: Secondary | ICD-10-CM | POA: Insufficient documentation

## 2016-12-04 NOTE — Patient Instructions (Addendum)
Continue on Xarelto. Transition dose as directed.  Do not take any NSAIDs - Advil, ibuprofen , aleve, etc.  Set up Echo in 3 months .  Discuss birth control options with GYN next month as discussed.  Follow up with Dr. Delton CoombesByrum in 3 months and As needed   Please contact office for sooner follow up if symptoms do not improve or worsen or seek emergency care

## 2016-12-04 NOTE — Assessment & Plan Note (Signed)
Provoked PE with Right DVT ( R ankle fx w/ boot immobolizer / BCP )  Remain on BCP . Discuss with GYN going forward.  Avoid NSAID . Report any bleeding .  Check echo in 3 months  Check Ven doppler in 6 month piror to stopping Xarelto  Cont on Xarelto for 6 months .   Plan  Patient Instructions  Continue on Xarelto. Transition dose as directed.  Do not take any NSAIDs - Advil, ibuprofen , aleve, etc.  Set up Echo in 3 months .  Discuss birth control options with GYN next month as discussed.  Follow up with Dr. Delton CoombesByrum in 3 months and As needed   Please contact office for sooner follow up if symptoms do not improve or worsen or seek emergency care

## 2016-12-04 NOTE — Assessment & Plan Note (Signed)
Cont on Xarelto  Check ven doppler in 6 mon

## 2016-12-04 NOTE — Progress Notes (Signed)
@Patient  ID: Samantha Samantha [name], female    DOB: [birth year], 36 y.o.   MRN: [id number]  Chief Complaint  Patient presents with  . Follow-up    PE     Referring provider: Wilfrid Lund, PA  HPI: 36 yo female seen for pulmonary consult 11/16/16 for provoked PE /DVT (BCP/ankle fx /boot immobilizer) with RHS on Echo   TEST  CTa Chest 11/16/16 >extensive bilateral PE , Ratio 1.5 . +evidence of RHS .  Echo : EF nml , RV mod dilated, PAP  Ven Doppler >Right peroneal vein DVT   12/04/2016 Follow up : PE /DVT  Pt returns for 2 week follow up . She was admitted 9/330/18 for dyspnea found to have acute bilateral PE . Echo showed right heart strain with PAP 44mm Hg . Ven Doppler was positive DVT on right peroneal vein. She was treated with IV heparin and converted to Xarelto .  Since discharge she is doing better with less dyspnea. Energy /activity level has not returned to normal. Gets tired easily . No chest pian, cough , edema or hemoptysis .  She has stopped BCP and has ov with GYN next month. Discussed preg category of meds.  Protein S was slightly low at 56. Protein ag total nml. Rest of hypercoagulable panel is nml.  She says she is tolerating Xarelto with no known bleeding . Currently on starter dose and has next month xarelto 20mg  dosing . Wants 90 d sent to pharmacy .        No Known Allergies  Immunization History  Administered Date(s) Administered  . Influenza Split 12/05/2015    Past Medical History:  Diagnosis Date  . Anal fissure   . Anxiety   . Bilateral bunions   . Bronchitis   . Insomnia   . Leg fracture     Tobacco History: History  Smoking Status  . Never Smoker  Smokeless Tobacco  . Never Used   Counseling given: Not Answered   Outpatient Encounter Prescriptions as of 12/04/2016  Medication Sig  . AMBULATORY NON FORMULARY MEDICATION Nitroglycerine ointment 0.125 %  Apply a pea sized amount internally four times daily. Dispense 30 GM  zero refill (Patient taking differently: Apply 1 application topically daily as needed (chest pain). Nitroglycerine ointment 0.125 %)  . diphenhydrAMINE (BENADRYL) 25 MG tablet Take 25 mg by mouth at bedtime.  Marland Kitchen LEXAPRO 20 MG tablet Take 20 mg by mouth daily.   . Melatonin 10 MG TABS Take 10 mg by mouth at bedtime.   . Multiple Vitamins-Minerals (MULTIVITAMIN WITH MINERALS) tablet Take 1 tablet by mouth daily.  . polyethylene glycol (MIRALAX / GLYCOLAX) packet Take 17 g by mouth daily as needed for mild constipation.  . Rivaroxaban (XARELTO) 15 MG TABS tablet Take 1 tablet (15 mg total) by mouth 2 (two) times daily with a meal.  . [START ON 12/10/2016] rivaroxaban (XARELTO) 20 MG TABS tablet Take 1 tablet (20 mg total) by mouth daily with supper.  . senna-docusate (SENOKOT-S) 8.6-50 MG tablet Take 1 tablet by mouth at bedtime as needed for mild constipation.  . traZODone (DESYREL) 50 MG tablet Take 25 mg by mouth at bedtime.  . [DISCONTINUED] HYDROcodone-acetaminophen (NORCO/VICODIN) 5-325 MG tablet Take 1 tablet by mouth every 8 (eight) hours as needed for moderate pain. (Patient not taking: Reported on 12/04/2016)  . [DISCONTINUED] lidocaine (XYLOCAINE) 5 % ointment Apply 1 application topically 2 (two) times daily as needed for mild pain.   . [DISCONTINUED]  Norgestimate-Ethinyl Estradiol Triphasic (ORTHO TRI-CYCLEN LO) 0.18/0.215/0.25 MG-25 MCG tab Take 1 tablet by mouth daily.   No facility-administered encounter medications on file as of 12/04/2016.      Review of Systems  Constitutional:   No  weight loss, night sweats,  Fevers, chills, fatigue, or  lassitude.  HEENT:   No headaches,  Difficulty swallowing,  Tooth/dental problems, or  Sore throat,                No sneezing, itching, ear ache, nasal congestion, post nasal drip,   CV:  No chest pain,  Orthopnea, PND, swelling in lower extremities, anasarca, dizziness, palpitations, syncope.   GI  No heartburn, indigestion, abdominal  pain, nausea, vomiting, diarrhea, change in bowel habits, loss of appetite, bloody stools.   Resp:  No excess mucus, no productive cough,  No non-productive cough,  No coughing up of blood.  No change in color of mucus.  No wheezing.  No chest wall deformity  Skin: no rash or lesions.  GU: no dysuria, change in color of urine, no urgency or frequency.  No flank pain, no hematuria   MS:  No joint pain or swelling.  No decreased range of motion.  No back pain.    Physical Exam  BP 122/76 (BP Location: Left Arm, Cuff Size: Normal)   Samantha [name] 80   Ht 5\' 8"  (1.727 m)   Wt 236 lb 6.4 oz (107.2 kg)   SpO2 97%   BMI 35.94 kg/m   GEN: A/Ox3; pleasant , NAD, obese    HEENT:  Samantha Samantha [name]/AT,  EACs-clear, TMs-wnl, NOSE-clear, THROAT-clear, no lesions, no postnasal drip or exudate noted.   NECK:  Supple w/ fair ROM; no JVD; normal carotid impulses w/o bruits; no thyromegaly or nodules palpated; no lymphadenopathy.    RESP  Clear  P & A; w/o, wheezes/ rales/ or rhonchi. no accessory muscle use, no dullness to percussion  CARD:  RRR, no m/r/g, no peripheral edema, pulses intact, no cyanosis or clubbing.  GI:   Soft & nt; nml bowel sounds; no organomegaly or masses detected.   Musco: Warm bil, no deformities or joint swelling noted.   Neuro: alert, no focal deficits noted.    Skin: Warm, no lesions or rashes    Lab Results:  CBC  BNP  ProBNP No results found for: PROBNP  Imaging: Ct Angio Chest Pe W Or Wo Contrast  Result Date: 11/16/2016 CLINICAL DATA:  36 y/o F; 36 y/o F; left-sided chest pain radiating to the back. EXAM: CT ANGIOGRAPHY CHEST WITH CONTRAST TECHNIQUE: Multidetector CT imaging of the chest was performed using the standard protocol during bolus administration of intravenous contrast. Multiplanar CT image reconstructions and MIPs were obtained to evaluate the vascular anatomy. CONTRAST:  61 cc Isovue 370 COMPARISON:  None. FINDINGS: Cardiovascular: Extensive bilateral  pulmonary embolus, no saddle embolus. RV/ LV equals 1.5. Normal heart size. No pericardial effusion. Normal thoracic aorta. Mediastinum/Nodes: No enlarged mediastinal, hilar, or axillary lymph nodes. Thyroid gland, trachea, and esophagus demonstrate no significant findings. Lungs/Pleura: Lungs are clear. No pleural effusion or pneumothorax. Upper Abdomen: No acute abnormality. Musculoskeletal: No chest wall abnormality. No acute or significant osseous findings. Review of the MIP images confirms the above findings. IMPRESSION: Positive for acute PE with CT evidence of right heart strain (RV/LV Ratio = 1.5) consistent with at least submassive (intermediate risk) PE. The presence of right heart strain has been associated with an increased risk of morbidity and mortality. Please activate Code PE by  paging [phone]. These results were called by telephone at the time of interpretation on 11/16/2016 at 3:26 am to Dr. Jaci Carrel , who verbally acknowledged these results. Electronically Signed   By: Mitzi Hansen M.D.   On: 11/16/2016 03:27     Assessment & Plan:   PE (pulmonary thromboembolism) (HCC) Provoked PE with Right DVT ( R ankle fx w/ boot immobolizer / BCP )  Remain on BCP . Discuss with GYN going forward.  Avoid NSAID . Report any bleeding .  Check echo in 3 months  Check Ven doppler in 6 month piror to stopping Xarelto  Cont on Xarelto for 6 months .   Plan  Patient Instructions  Continue on Xarelto. Transition dose as directed.  Do not take any NSAIDs - Advil, ibuprofen , aleve, etc.  Set up Echo in 3 months .  Discuss birth control options with GYN next month as discussed.  Follow up with Dr. Delton Coombes in 3 months and As needed   Please contact office for sooner follow up if symptoms do not improve or worsen or seek emergency care       DVT (deep venous thrombosis) (HCC) Cont on Xarelto  Check ven doppler in 6 mon      Rubye Oaks, NP 12/04/2016

## 2016-12-22 ENCOUNTER — Telehealth: Payer: Self-pay | Admitting: Adult Health

## 2016-12-22 MED ORDER — RIVAROXABAN 20 MG PO TABS: 20.0000 mg | ORAL_TABLET | Freq: Every day | ORAL | 0 refills | Status: DC

## 2016-12-22 NOTE — Telephone Encounter (Signed)
Spoke with patient. She was requesting her Xarelto to be sent to [facility]. Verified the dosage with patient. Advised patient that I would send in RX for her. She verbalized understanding. Nothing else needed at time of call.

## 2016-12-24 DIAGNOSIS — S82391D Other fracture of lower end of right tibia, subsequent encounter for closed fracture with routine healing: Secondary | ICD-10-CM | POA: Diagnosis not present

## 2016-12-26 DIAGNOSIS — S82391D Other fracture of lower end of right tibia, subsequent encounter for closed fracture with routine healing: Secondary | ICD-10-CM | POA: Diagnosis not present

## 2016-12-29 DIAGNOSIS — S82391D Other fracture of lower end of right tibia, subsequent encounter for closed fracture with routine healing: Secondary | ICD-10-CM | POA: Diagnosis not present

## 2017-01-05 DIAGNOSIS — S82391D Other fracture of lower end of right tibia, subsequent encounter for closed fracture with routine healing: Secondary | ICD-10-CM | POA: Diagnosis not present

## 2017-01-12 DIAGNOSIS — S82391D Other fracture of lower end of right tibia, subsequent encounter for closed fracture with routine healing: Secondary | ICD-10-CM | POA: Diagnosis not present

## 2017-01-15 ENCOUNTER — Ambulatory Visit: Payer: 59 | Admitting: Gastroenterology

## 2017-01-19 DIAGNOSIS — Z01419 Encounter for gynecological examination (general) (routine) without abnormal findings: Secondary | ICD-10-CM | POA: Diagnosis not present

## 2017-01-20 DIAGNOSIS — S82391D Other fracture of lower end of right tibia, subsequent encounter for closed fracture with routine healing: Secondary | ICD-10-CM | POA: Diagnosis not present

## 2017-01-28 ENCOUNTER — Ambulatory Visit (INDEPENDENT_AMBULATORY_CARE_PROVIDER_SITE_OTHER): Payer: 59 | Admitting: Gastroenterology

## 2017-01-28 ENCOUNTER — Encounter: Payer: Self-pay | Admitting: Gastroenterology

## 2017-01-28 ENCOUNTER — Ambulatory Visit: Payer: 59 | Admitting: Gastroenterology

## 2017-01-28 VITALS — BP 118/82 | HR 64 | Ht 68.0 in | Wt 240.0 lb

## 2017-01-28 DIAGNOSIS — K6289 Other specified diseases of anus and rectum: Secondary | ICD-10-CM

## 2017-01-28 DIAGNOSIS — K625 Hemorrhage of anus and rectum: Secondary | ICD-10-CM | POA: Diagnosis not present

## 2017-01-28 NOTE — Progress Notes (Addendum)
01/28/2017 Samantha [name] [id number] 30-Mar-1980   HISTORY OF PRESENT ILLNESS:  This is a pleasant 36 year old female who I saw back in May for complaints of rectal bleeding and pain.  I diagnosed her with anal fissure and treated her with nitro gel.  She says that the rectal pain has about resolved.  Rectal bleeding still occurring on and off.  No definite source found on exam , including anoscopy today.  She is currently on Xarelto since 10/1 due to pulmonary embolism after an ankle fracture.  She has met her deductible so is hoping that if anything needs to be performed then it could be done this year so that is why she wanted to just come in and touch base with [place] today.  She says that overall she is about 95% better.    Past Medical History:  Diagnosis Date  . Anal fissure   . Anxiety   . Bilateral bunions   . Bronchitis   . Insomnia   . Leg fracture    Past Surgical History:  Procedure Laterality Date  . LEG SURGERY    . WISDOM TOOTH EXTRACTION      reports that  has never smoked. she has never used smokeless tobacco. She reports that she drinks alcohol. She reports that she does not use drugs. family history includes Cancer in her paternal grandfather; Diabetes in her father; Thyroid disease in her mother. No Known Allergies    Outpatient Encounter Medications as of 01/28/2017  Medication Sig  . diphenhydrAMINE (BENADRYL) 25 MG tablet Take 25 mg by mouth at bedtime.  Marland Kitchen LEXAPRO 20 MG tablet Take 20 mg by mouth daily.   . Melatonin 10 MG TABS Take 10 mg by mouth at bedtime.   . Multiple Vitamins-Minerals (MULTIVITAMIN WITH MINERALS) tablet Take 1 tablet by mouth daily.  . Rivaroxaban (XARELTO) 15 MG TABS tablet Take 1 tablet (15 mg total) by mouth 2 (two) times daily with a meal.  . traZODone (DESYREL) 50 MG tablet Take 25 mg by mouth at bedtime.  . AMBULATORY NON FORMULARY MEDICATION Nitroglycerine ointment 0.125 %  Apply a pea sized amount internally four  times daily. Dispense 30 GM zero refill (Patient taking differently: Apply 1 application topically daily as needed (chest pain). Nitroglycerine ointment 0.125 %)  . [DISCONTINUED] polyethylene glycol (MIRALAX / GLYCOLAX) packet Take 17 g by mouth daily as needed for mild constipation.  . [DISCONTINUED] rivaroxaban (XARELTO) 20 MG TABS tablet Take 1 tablet (20 mg total) daily with supper by mouth.  . [DISCONTINUED] senna-docusate (SENOKOT-S) 8.6-50 MG tablet Take 1 tablet by mouth at bedtime as needed for mild constipation.   No facility-administered encounter medications on file as of 01/28/2017.      REVIEW OF SYSTEMS  : All other systems reviewed and negative except where noted in the History of Present Illness.   PHYSICAL EXAM: BP 118/82   Pulse 64   Ht '5\' 8"'  (1.727 m)   Wt 240 lb (108.9 kg)   BMI 36.49 kg/m  General: Well developed white female in no acute distress Head: Normocephalic and atraumatic Eyes:  Sclerae anicteric, conjunctiva pink. Ears: Normal auditory acuity Lungs: Clear throughout to auscultation; no increased WOB. Heart: Regular rate and rhythm; no M/R/G. Abdomen: Soft, non-distended.  BS present.  Non-tender. Rectal:  Perianal skin tags noted.  No definite fissure seen.  DRE did not reveal any masses.  Anoscopy was performed and showed a small amount of hemorrhoids, no  definite fissure. Musculoskeletal: Symmetrical with no gross deformities  Skin: No lesions on visible extremities Extremities: No edema  Neurological: Alert oriented x 4, grossly non-focal Psychological:  Alert and cooperative. Normal mood and affect  ASSESSMENT AND PLAN: *Rectal bleeding, rectal pain:  Rectal pain about resolved.  Rectal bleeding still occurring on and off.  Had an anal fissure previously when she was seen back in May.  No definite source found on exam, including anoscopy today.  She is currently on Xarelto since 10/1 due to pulmonary embolism after an ankle fracture.  She has  met her deductible so is hoping that if anything needs to be performed then it could be done this year.  I discussed with Dr. Hilarie Fredrickson as Dr. Ardis Hughs did not have any procedure times available in the near future.  Dr. Hilarie Fredrickson is agreeable to perform a flexible sigmoidoscopy while she is on her Xarelto on 12/17 to get a better assessment.   CC:  Lois Huxley, PA  Addendum: Reviewed and agree with initial management. Pyrtle, Lajuan Lines, MD

## 2017-01-28 NOTE — Patient Instructions (Signed)
If you are age 37 or older, your body mass index should be between 23-30. Your Body mass index is 36.49 kg/m. If this is out of the aforementioned range listed, please consider follow up with your Primary Care Provider.  If you are age 664 or younger, your body mass index should be between 19-25. Your Body mass index is 36.49 kg/m. If this is out of the aformentioned range listed, please consider follow up with your Primary Care Provider.   You have been scheduled for a flexible sigmoidoscopy. Please follow the written instructions given to you at your visit today. If you use inhalers (even only as needed), please bring them with you on the day of your procedure.  Thank you.

## 2017-01-29 NOTE — Progress Notes (Signed)
I agree with the above note, plan.  Appreciate Dr. Lauro FranklinPyrtle's help with her.

## 2017-02-02 ENCOUNTER — Encounter: Payer: Self-pay | Admitting: Internal Medicine

## 2017-02-02 ENCOUNTER — Other Ambulatory Visit: Payer: Self-pay

## 2017-02-02 ENCOUNTER — Ambulatory Visit (AMBULATORY_SURGERY_CENTER): Payer: 59 | Admitting: Internal Medicine

## 2017-02-02 VITALS — BP 136/82 | HR 63 | Temp 97.3°F | Resp 22 | Ht 68.0 in | Wt 240.0 lb

## 2017-02-02 DIAGNOSIS — K6289 Other specified diseases of anus and rectum: Secondary | ICD-10-CM

## 2017-02-02 MED ORDER — SODIUM CHLORIDE 0.9 % IV SOLN: 500.0000 mL | Freq: Once | INTRAVENOUS | Status: DC

## 2017-02-02 NOTE — Progress Notes (Signed)
Report to PACU, RN, vss, BBS= Clear.  

## 2017-02-02 NOTE — Op Note (Signed)
[facility] Patient Name: Samantha Chambersmanda Lehmert Killian Procedure Date: 02/02/2017 11:04 AM MRN: [id number] Endoscopist: Beverley FiedlerJay M Jaquesha Boroff , MD Age: [age] Referring MD:  Date of Birth: 12/11/1980 Gender: Female Account #: [id number] Procedure:                Flexible Sigmoidoscopy Indications:              Rectal hemorrhage Medicines:                Monitored Anesthesia Care Procedure:                Pre-Anesthesia Assessment:                           - Prior to the procedure, a History and Physical                            was performed, and patient medications and                            allergies were reviewed. The patient's tolerance of                            previous anesthesia was also reviewed. The risks                            and benefits of the procedure and the sedation                            options and risks were discussed with the patient.                            All questions were answered, and informed consent                            was obtained. Prior Anticoagulants: The patient has                            taken Xarelto (rivaroxaban), last dose was day of                            procedure. ASA Grade Assessment: II - A patient                            with mild systemic disease. After reviewing the                            risks and benefits, the patient was deemed in                            satisfactory condition to undergo the procedure.                           After obtaining informed consent, the scope was  passed under direct vision. The Colonoscope was                            introduced through the anus and advanced to the the                            descending colon. The flexible sigmoidoscopy was                            accomplished without difficulty. The patient                            tolerated the procedure well. The quality of the                            bowel preparation was  good. Scope In: Scope Out: Findings:                 The perianal exam findings include small anterior                            and posterior skin tags.                           Normal mucosa was found in the rectum, in the                            sigmoid colon and in the examined portion of the                            descending colon.                           Anal papilla(e) were hypertrophied on retroflexion                            with no other notable pathology. Complications:            No immediate complications. Estimated Blood Loss:     Estimated blood loss: none. Impression:               - Perianal skin tags found on perianal exam.                           - Normal mucosa in the rectum, in the sigmoid colon                            and in the distal descending colon.                           - Anal papilla(e) were hypertrophied.                           - Anal canal pathology (such as intermittent  fissure) most likely cause of intermittent blood                            with wiping.                           - No specimens collected. Recommendation:           - Patient has a contact number available for                            emergencies. The signs and symptoms of potential                            delayed complications were discussed with the                            patient. Return to normal activities tomorrow.                            Written discharge instructions were provided to the                            patient.                           - Resume previous diet.                           - Continue present medications. If stool is                            frequently hard, would add Benefiber daily and                            stool softeners.                           - If bleeding continues or is found in the stool,                            full colonoscopy is recommended. Beverley Fiedler,  MD 02/02/2017 11:27:59 AM This report has been signed electronically.

## 2017-02-02 NOTE — Patient Instructions (Signed)
  Ok to use Benefiber daily and stool softeners for hard stool.   If bleeding continues ,a full Colonoscopy recommended   YOU HAD AN ENDOSCOPIC PROCEDURE TODAY AT THE [facility]:   Refer to the procedure report that was given to you for any specific questions about what was found during the examination.  If the procedure report does not answer your questions, please call your gastroenterologist to clarify.  If you requested that your care partner not be given the details of your procedure findings, then the procedure report has been included in a sealed envelope for you to review at your convenience later.  YOU SHOULD EXPECT: Some feelings of bloating in the abdomen. Passage of more gas than usual.  Walking can help get rid of the air that was put into your GI tract during the procedure and reduce the bloating. If you had a lower endoscopy (such as a colonoscopy or flexible sigmoidoscopy) you may notice spotting of blood in your stool or on the toilet paper. If you underwent a bowel prep for your procedure, you may not have a normal bowel movement for a few days.  Please Note:  You might notice some irritation and congestion in your nose or some drainage.  This is from the oxygen used during your procedure.  There is no need for concern and it should clear up in a day or so.  SYMPTOMS TO REPORT IMMEDIATELY:   Following lower endoscopy (colonoscopy or flexible sigmoidoscopy):  Excessive amounts of blood in the stool  Significant tenderness or worsening of abdominal pains  Swelling of the abdomen that is new, acute  Fever of 100F or higher   For urgent or emergent issues, a gastroenterologist can be reached at any hour by calling [phone].   DIET:  We do recommend a small meal at first, but then you may proceed to your regular diet.  Drink plenty of fluids but you should avoid alcoholic beverages for 24 hours.  ACTIVITY:  You should plan to take it easy for the rest of  today and you should NOT DRIVE or use heavy machinery until tomorrow (because of the sedation medicines used during the test).    FOLLOW UP: Our staff will call the number listed on your records the next business day following your procedure to check on you and address any questions or concerns that you may have regarding the information given to you following your procedure. If we do not reach you, we will leave a message.  However, if you are feeling well and you are not experiencing any problems, there is no need to return our call.  We will assume that you have returned to your regular daily activities without incident.  If any biopsies were taken you will be contacted by phone or by letter within the next 1-3 weeks.  Please call us at [phone] if you have not heard about the biopsies in 3 weeks.    SIGNATURES/CONFIDENTIALITY: You and/or your care partner have signed paperwork which will be entered into your electronic medical record.  These signatures attest to the fact that that the information above on your After Visit Summary has been reviewed and is understood.  Full responsibility of the confidentiality of this discharge information lies with you and/or your care-partner.

## 2017-02-03 ENCOUNTER — Telehealth: Payer: Self-pay

## 2017-02-03 ENCOUNTER — Telehealth: Payer: Self-pay | Admitting: *Deleted

## 2017-02-03 NOTE — Telephone Encounter (Signed)
Left message on f/u call 

## 2017-02-03 NOTE — Telephone Encounter (Signed)
Left message on voicemail.

## 2017-02-12 DIAGNOSIS — M545 Low back pain: Secondary | ICD-10-CM | POA: Diagnosis not present

## 2017-02-14 ENCOUNTER — Other Ambulatory Visit: Payer: Self-pay | Admitting: Adult Health

## 2017-03-10 ENCOUNTER — Encounter: Payer: Self-pay | Admitting: Emergency Medicine

## 2017-03-10 ENCOUNTER — Ambulatory Visit: Payer: 59 | Admitting: Emergency Medicine

## 2017-03-10 VITALS — BP 128/90 | HR 85 | Ht 68.0 in | Wt 241.0 lb

## 2017-03-10 DIAGNOSIS — I2699 Other pulmonary embolism without acute cor pulmonale: Secondary | ICD-10-CM

## 2017-03-10 DIAGNOSIS — I272 Pulmonary hypertension, unspecified: Secondary | ICD-10-CM | POA: Diagnosis not present

## 2017-03-10 NOTE — Progress Notes (Signed)
 @Patient  ID: Samantha [name], female    DOB: January 28, 1981, 37 y.o.   MRN: [id number]  Chief Complaint  Patient presents with  . Follow-up    Referring provider: Wilfrid LundBecker, Anna G, PA  HPI: 37 yo female seen for pulmonary consult 11/16/16 for provoked PE /DVT (BCP/ankle fx /boot immobilizer) with RHS on Echo   TEST  CTa Chest 11/16/16 >extensive bilateral PE , Ratio 1.5 . +evidence of RHS .  Echo : EF nml , RV mod dilated, PAP 44mmHg  Ven Doppler >Right peroneal vein DVT   Follow up : PE /DVT  Pt returns for 2 week follow up . She was admitted 9/330/18 for dyspnea found to have acute bilateral PE . Echo showed right heart strain with PAP 44mm Hg . Ven Doppler was positive DVT on right peroneal vein. She was treated with IV heparin and converted to Xarelto .  Since discharge she is doing better with less dyspnea. Energy /activity level has not returned to normal. Gets tired easily . No chest pian, cough , edema or hemoptysis .  She has stopped BCP and has ov with GYN next month. Discussed preg category of meds.  Protein S was slightly low at 56. Protein ag total nml. Rest of hypercoagulable panel is nml.  She says she is tolerating Xarelto with no known bleeding . Currently on starter dose and has next month xarelto 20mg  dosing . Wants 90 d sent to pharmacy .   ROV 03/10/17 --37 year old never smoker who was admitted September 2018 for DVT and bilateral pulmonary emboli, likely provoked.  Oral contraceptive discontinued.  She has been treated with Xarelto for the last 3 months.  Echocardiogram 11/17/16 was reviewed.  This showed intact left ventricular function, moderately dilated right ventricle with moderately reduced function and an estimated PASP 44 mmHg. She is feeling stronger, breathing has returned to normal. She is beginning to exercise. No side effects. Has had heavy periods, some more sensitive bruising. She notes some new palpitations over the last few days.    No Known  Allergies  Immunization History  Administered Date(s) Administered  . Influenza Split 12/05/2015    Past Medical History:  Diagnosis Date  . Anal fissure   . Anxiety   . Bilateral bunions   . Bronchitis   . Clotting disorder (HCC)    DVT/PE 2018  . Insomnia   . Leg fracture     Tobacco History: Social History   Tobacco Use  Smoking Status Never Smoker  Smokeless Tobacco Never Used   Counseling given: Not Answered   Outpatient Encounter Medications as of 03/10/2017  Medication Sig  . AMBULATORY NON FORMULARY MEDICATION Nitroglycerine ointment 0.125 %  Apply a pea sized amount internally four times daily. Dispense 30 GM zero refill (Patient taking differently: Apply 1 application topically daily as needed (chest pain). Nitroglycerine ointment 0.125 %)  . cyclobenzaprine (FLEXERIL) 10 MG tablet Take 10 mg by mouth 3 (three) times daily as needed for muscle spasms.  . diphenhydrAMINE (BENADRYL) 25 MG tablet Take 25 mg by mouth at bedtime.  Marland Kitchen. LEXAPRO 20 MG tablet Take 20 mg by mouth daily.   . Melatonin 10 MG TABS Take 10 mg by mouth at bedtime.   . Multiple Vitamins-Minerals (MULTIVITAMIN WITH MINERALS) tablet Take 1 tablet by mouth daily.  . rivaroxaban (XARELTO) 20 MG TABS tablet Take 20 mg by mouth daily with supper.  . traZODone (DESYREL) 50 MG tablet Take 25 mg by mouth at bedtime.  . [  DISCONTINUED] Rivaroxaban (XARELTO) 15 MG TABS tablet Take 1 tablet (15 mg total) by mouth 2 (two) times daily with a meal.  . [DISCONTINUED] XARELTO 20 MG TABS tablet TAKE 1 TABLET BY MOUTH WITH SUPPER   Facility-Administered Encounter Medications as of 03/10/2017  Medication  . 0.9 %  sodium chloride infusion     Physical Exam  BP 128/90 (BP Location: Left Arm, Cuff Size: Normal)   Pulse 85   Ht 5\' 8"  (1.727 m)   Wt 241 lb (109.3 kg)   SpO2 96%   BMI 36.64 kg/m   Gen: Pleasant, well-nourished, in no distress,  normal affect  ENT: No lesions,  mouth clear,  oropharynx  clear, no postnasal drip  Neck: No JVD, no stridor  Lungs: No use of accessory muscles, clear without rales or rhonchi  Cardiovascular: RRR, heart sounds normal, no murmur or gallops, no peripheral edema  Musculoskeletal: No deformities, no cyanosis or clubbing  Neuro: alert, non focal  Skin: Warm, no lesions or rashes    Assessment & Plan:   PE (pulmonary thromboembolism) (HCC) We will plan to new Xarelto 20 mg daily through April 2019. We will repeat your echocardiogram end of April 2019 to compare with your prior. We may decide to repeat some of your hypercoagulability labs this summer. You will not be able to be on oral contraceptive medication going forward Follow with Dr Delton Coombes in May 2019   Levy Pupa, MD, PhD 03/10/2017, 9:46 AM [facility] [phone] or if no answer [phone]

## 2017-03-10 NOTE — Patient Instructions (Addendum)
We will plan to new Xarelto 20 mg daily through April 2019. We will repeat your echocardiogram end of April 2019 to compare with your prior. We may decide to repeat some of your hypercoagulability labs this summer. You will not be able to be on oral contraceptive medication going forward Follow with Dr Delton CoombesByrum in May 2019

## 2017-03-10 NOTE — Assessment & Plan Note (Signed)
We will plan to new Xarelto 20 mg daily through April 2019. We will repeat your echocardiogram end of April 2019 to compare with your prior. We may decide to repeat some of your hypercoagulability labs this summer. You will not be able to be on oral contraceptive medication going forward Follow with Dr Violeta Lecount in May 2019 

## 2017-03-14 DIAGNOSIS — L0291 Cutaneous abscess, unspecified: Secondary | ICD-10-CM | POA: Diagnosis not present

## 2017-05-13 ENCOUNTER — Telehealth: Payer: Self-pay | Admitting: Emergency Medicine

## 2017-05-13 NOTE — Telephone Encounter (Signed)
Based on when she started, she should be able to stop it beginning of April

## 2017-05-13 NOTE — Telephone Encounter (Signed)
Called pt letting her know per RB, pt should be able to stop taking Xarelto beginning of April.  Pt expressed understanding. Nothing further needed at this time.

## 2017-05-13 NOTE — Telephone Encounter (Signed)
"   We will plan to new Xarelto 20 mg daily through April 2019. We will repeat your echocardiogram end of April 2019 to compare with your prior. We may decide to repeat some of your hypercoagulability labs this summer. You will not be able to be on oral contraceptive medication going forward Follow with Dr Delton CoombesByrum in May 2019"  Spoke with patient. She was wondering about her Xarelto. She was advised to take this medication until April 2019. She didn't know if she needed to stop on April 1st or April 31st.   RB, please advise. Thanks!

## 2017-05-14 ENCOUNTER — Other Ambulatory Visit: Payer: Self-pay | Admitting: Adult Health

## 2017-05-14 ENCOUNTER — Telehealth: Payer: Self-pay | Admitting: Emergency Medicine

## 2017-05-14 NOTE — Telephone Encounter (Signed)
Per patient's chart, she called yesterday 3.27.19 regarding her Xarelto RB reported patient can stop this medication at the beginning of April 2019  Leslye PeerByrum, Robert S, MD to [facility] Triage Pool  05/13/17 2:59 PM  Note   Based on when she started, she should be able to stop it beginning of April     There is a refill request under TP's name for the Xarelto, but to go to [facility] x[facility] to verify with patient what pharmacy she would like her Rx sent to and if she would like a full 30 day supply or a short-term supply since she will be stopping medication in a few days

## 2017-05-14 NOTE — Telephone Encounter (Signed)
Patient returned called - pt states that she is aware to stop the Xarelto and she doesn't need the refill that was sent from her pharmacy - She states she doesn't need a call back - her ph# [phone] if necessary -pr

## 2017-05-14 NOTE — Telephone Encounter (Signed)
Advised patient that we have d/c the prescription from her list. Nothing further needed.

## 2017-06-16 ENCOUNTER — Other Ambulatory Visit: Payer: Self-pay

## 2017-06-16 ENCOUNTER — Ambulatory Visit (HOSPITAL_COMMUNITY): Payer: 59 | Attending: Cardiovascular Disease

## 2017-06-16 DIAGNOSIS — I272 Pulmonary hypertension, unspecified: Secondary | ICD-10-CM

## 2017-06-29 ENCOUNTER — Encounter: Payer: Self-pay | Admitting: Emergency Medicine

## 2017-06-29 ENCOUNTER — Other Ambulatory Visit: Payer: 59

## 2017-06-29 ENCOUNTER — Ambulatory Visit: Payer: 59 | Admitting: Emergency Medicine

## 2017-06-29 DIAGNOSIS — I2699 Other pulmonary embolism without acute cor pulmonale: Secondary | ICD-10-CM

## 2017-06-29 DIAGNOSIS — I2721 Secondary pulmonary arterial hypertension: Secondary | ICD-10-CM

## 2017-06-29 NOTE — Addendum Note (Signed)
Addended by: Jaynee Eagles C on: 06/29/2017 11:20 AM   Modules accepted: Orders

## 2017-06-29 NOTE — Progress Notes (Signed)
  ID: Samantha [name], female    DOB: [birth year], 37 y.o.   MRN: [id number]  Chief Complaint  Patient presents with  . Follow-up    Referring provider: Wilfrid Lund, PA  HPI: 37 yo female seen for pulmonary consult 11/16/16 for provoked PE /DVT (BCP/ankle fx /boot immobilizer) with RHS on Echo   TEST  CTa Chest 11/16/16 >extensive bilateral PE , Ratio 1.5 . +evidence of RHS .  Echo : EF nml , RV mod dilated, PAP  Ven Doppler >Right peroneal vein DVT   Follow up : PE /DVT  Pt returns for 2 week follow up . She was admitted 9/330/18 for dyspnea found to have acute bilateral PE . Echo showed right heart strain with PAP 44mm Hg . Ven Doppler was positive DVT on right peroneal vein. She was treated with IV heparin and converted to Xarelto .  Since discharge she is doing better with less dyspnea. Energy /activity level has not returned to normal. Gets tired easily . No chest pian, cough , edema or hemoptysis .  She has stopped BCP and has ov with GYN next month. Discussed preg category of meds.  Protein S was slightly low at 56. Protein ag total nml. Rest of hypercoagulable panel is nml.  She says she is tolerating Xarelto with no known bleeding . Currently on starter dose and has next month xarelto  dosing . Wants 90 d sent to pharmacy .   ROV 03/10/17 --37 year old never smoker who was admitted September 2018 for DVT and bilateral pulmonary emboli, likely provoked.  Oral contraceptive discontinued.  She has been treated with Xarelto for the last 3 months.  Echocardiogram 11/17/16 was reviewed.  This showed intact left ventricular function, moderately dilated right ventricle with moderately reduced function and an estimated PASP 44 mmHg. She is feeling stronger, breathing has returned to normal. She is beginning to exercise. No side effects. Has had heavy periods, some more sensitive bruising. She notes some new palpitations over the last few days.   ROV 06/29/17  --this is a follow-up visit for 37 year old woman who has a history of DVT and bilateral pulmonary emboli diagnosed in September 2018.  There were likely provoked, had a history of oral contraceptive use.  Her most recent echocardiogram was 06/16/2017 and I reviewed the results.  This shows an improvement in her estimated PA systolic pressure from 44 to 27 mmHg.  She came off of Xarelto in April 2018, at the 14-month mark. She reports that she had been well, just noticed this weekend that her R calf started to have some pain, and her R foot had some swelling.   Physical Exam  BP (!) 142/88 (BP Location: Left Arm, Cuff Size: Normal)   [name] 60   Wt 252 lb (114.3 kg)   SpO2 97%   BMI 38.32 kg/m   Gen: Pleasant, well-nourished, in no distress,  normal affect  ENT: No lesions,  mouth clear,  oropharynx clear, no postnasal drip  Neck: No JVD, no stridor  Lungs: No use of accessory muscles, clear without rales or rhonchi  Cardiovascular: RRR, heart sounds normal, no murmur or gallops, no peripheral edema  Musculoskeletal: No deformities, no cyanosis or clubbing  Neuro: alert, non focal  Skin: Warm, no lesions or rashes    Assessment & Plan:   DVT (deep venous thrombosis) (HCC) She had been well and has been off of Xarelto for about 6 weeks.  Unfortunately she recently had some increased  lower extremity edema and right calf pain, same leg where her original DVT was located.  Unclear to me whether this is evidence for an acute clot, chronic clot or just a change in her venous return in the aftermath of DVT.  I think she needs a repeat duplex ultrasound to characterize.  PE (pulmonary thromboembolism) (HCC) She has new left to mid back intermittent pain for the last 2 days.  Unfortunately she is at risk for recurrent DVT and pulmonary embolism.  We will check a d-dimer, depending on the results we will decide to perform either a VQ scan or a CT pulmonary angiogram.  If the d-dimer is negative  then we will forego chest imaging.  Secondary pulmonary arterial hypertension (HCC) Due to pulmonary embolism.  Her repeat echocardiogram 06/16/2017 showed normalization of her pulmonary pressures.  This is good news.  She shouldn't need any repeat echocardiogram   Levy Pupa, MD, PhD 06/29/2017, 11:17 AM [facility] [phone] or if no answer [phone]

## 2017-06-29 NOTE — Assessment & Plan Note (Signed)
She had been well and has been off of Xarelto for about 6 weeks.  Unfortunately she recently had some increased lower extremity edema and right calf pain, same leg where her original DVT was located.  Unclear to me whether this is evidence for an acute clot, chronic clot or just a change in her venous return in the aftermath of DVT.  I think she needs a repeat duplex ultrasound to characterize.

## 2017-06-29 NOTE — Assessment & Plan Note (Signed)
Due to pulmonary embolism.  Her repeat echocardiogram 06/16/2017 showed normalization of her pulmonary pressures.  This is good news.  She shouldn't need any repeat echocardiogram

## 2017-06-29 NOTE — Assessment & Plan Note (Signed)
She has new left to mid back intermittent pain for the last 2 days.  Unfortunately she is at risk for recurrent DVT and pulmonary embolism.  We will check a d-dimer, depending on the results we will decide to perform either a VQ scan or a CT pulmonary angiogram.  If the d-dimer is negative then we will forego chest imaging.

## 2017-06-29 NOTE — Patient Instructions (Signed)
We will perform blood work today (d-dimer). We will repeat your right lower extremity duplex venous ultrasound to look for either acute or chronic clot Depending on the results of your blood work we may decide to perform repeat chest imaging, either a CT scan or a ventilation/perfusion scan.  We will review this and make these plans by phone once lab work is available. Your pulmonary pressures on your most recent echocardiogram have normalized.  This is good news. Follow with Dr Delton Coombes in 1 month or next available

## 2017-06-30 ENCOUNTER — Ambulatory Visit (HOSPITAL_COMMUNITY)
Admission: RE | Admit: 2017-06-30 | Discharge: 2017-06-30 | Disposition: A | Discharge status: Home / Self Care | Payer: 59 | Source: Ambulatory Visit | Attending: Cardiovascular Disease | Admitting: Cardiovascular Disease

## 2017-06-30 DIAGNOSIS — Z719 Counseling, unspecified: Secondary | ICD-10-CM | POA: Diagnosis not present

## 2017-06-30 DIAGNOSIS — I2699 Other pulmonary embolism without acute cor pulmonale: Secondary | ICD-10-CM

## 2017-06-30 LAB — D-DIMER, QUANTITATIVE: D-Dimer, Quant: 0.47 mcg/mL FEU (ref <0.50)

## 2017-07-01 DIAGNOSIS — M545 Low back pain: Secondary | ICD-10-CM | POA: Diagnosis not present

## 2017-07-02 ENCOUNTER — Telehealth: Payer: Self-pay | Admitting: Emergency Medicine

## 2017-07-02 NOTE — Telephone Encounter (Signed)
Called and spoke with patient, she is requesting results from blood work and doppler. I do not see where they have been resulted.   RB please advise, thank you.

## 2017-07-06 NOTE — Telephone Encounter (Signed)
Pt is aware of below message and voiced her understanding. Nothing further is needed. 

## 2017-07-06 NOTE — Telephone Encounter (Signed)
Pt is requesting lab & venous doppler results today if possible.   RB please advise. Thanks

## 2017-07-06 NOTE — Telephone Encounter (Signed)
Patient calling requesting results.  States she needs them today if possible.  CB is [phone]

## 2017-07-06 NOTE — Telephone Encounter (Signed)
Please let her know that her lab work was normal, does not support a new blood clot. Also her LE doppler US was normal > no new clot in the leg. This is good news.

## 2017-07-15 DIAGNOSIS — Z719 Counseling, unspecified: Secondary | ICD-10-CM | POA: Diagnosis not present

## 2017-08-10 ENCOUNTER — Encounter: Payer: Self-pay | Admitting: Emergency Medicine

## 2017-08-10 ENCOUNTER — Ambulatory Visit: Payer: 59 | Admitting: Emergency Medicine

## 2017-08-10 DIAGNOSIS — I2699 Other pulmonary embolism without acute cor pulmonale: Secondary | ICD-10-CM

## 2017-08-10 NOTE — Progress Notes (Signed)
@Patient  ID: Samantha [name], female    DOB: Mar 11, 1980, 37 y.o.   MRN: [id number]  Chief Complaint  Patient presents with  . Follow-up    Referring provider: Wilfrid Lund, PA  HPI: 37 yo female seen for pulmonary consult 11/16/16 for provoked PE /DVT (BCP/ankle fx /boot immobilizer) with RHS on Echo   TEST  CTa Chest 11/16/16 >extensive bilateral PE , Ratio 1.5 . +evidence of RHS .  Echo : EF nml , RV mod dilated, PAP  Ven Doppler >Right peroneal vein DVT   Follow up : PE /DVT  Pt returns for 2 week follow up . She was admitted 9/330/18 for dyspnea found to have acute bilateral PE . Echo showed right heart strain with PAP 44mm Hg . Ven Doppler was positive DVT on right peroneal vein. She was treated with IV heparin and converted to Xarelto .  Since discharge she is doing better with less dyspnea. Energy /activity level has not returned to normal. Gets tired easily . No chest pian, cough , edema or hemoptysis .  She has stopped BCP and has ov with GYN next month. Discussed preg category of meds.  Protein S was slightly low at 56. Protein ag total nml. Rest of hypercoagulable panel is nml.  She says she is tolerating Xarelto with no known bleeding . Currently on starter dose and has next month xarelto 20mg  dosing . Wants 90 d sent to pharmacy .   ROV 03/10/17 --37 year old never smoker who was admitted September 2018 for DVT and bilateral pulmonary emboli, likely provoked.  Oral contraceptive discontinued.  She has been treated with Xarelto for the last 3 months.  Echocardiogram 11/17/16 was reviewed.  This showed intact left ventricular function, moderately dilated right ventricle with moderately reduced function and an estimated PASP 44 mmHg. She is feeling stronger, breathing has returned to normal. She is beginning to exercise. No side effects. Has had heavy periods, some more sensitive bruising. She notes some new palpitations over the last few days.   ROV 06/29/17  --this is a follow-up visit for 37 year old woman who has a history of DVT and bilateral pulmonary emboli diagnosed in September 2018.  There were likely provoked, had a history of oral contraceptive use.  Her most recent echocardiogram was 06/16/2017 and I reviewed the results.  This shows an improvement in her estimated PA systolic pressure from 44 to 27 mmHg.  She came off of Xarelto in April 2018, at the 33-month mark. She reports that she had been well, just noticed this weekend that her R calf started to have some pain, and her R foot had some swelling.   ROV 08/10/17 --37 year old never smoker with history of DVT and bilateral pulmonary emboli (10/2016), likely provoked by OCP use.  I saw her in May and she was experiencing some right calf pain and left mid back intermittent pain after coming off her Xarelto.  I performed a d-dimer which was reassuring, a lower extremity ultrasound which did not show any new problem embolic disease.     Physical Exam  BP 136/84 (BP Location: Left Arm)   [name] 78   Wt 250 lb (113.4 kg)   SpO2 93%   BMI 38.01 kg/m   Gen: Pleasant, well-nourished, in no distress,  normal affect  ENT: No lesions,  mouth clear,  oropharynx clear, no postnasal drip  Neck: No JVD, no stridor  Lungs: No use of accessory muscles, clear without rales or rhonchi  Cardiovascular:  RRR, heart sounds normal, no murmur or gallops, no peripheral edema  Musculoskeletal: No deformities, no cyanosis or clubbing  Neuro: alert, non focal  Skin: Warm, no lesions or rashes    Assessment & Plan:   PE (pulmonary thromboembolism) (HCC) Reassuring d-dimer and right lower extremity ultrasound since last time.  Her hypercoagulablity panel from September 2018 was negative.  I do not think we need to repeat it.  she can stay off Xarelto. Discussed strategies to avoid recurrent DVT with her today.  No indication to restart blood thinning medication at this time. Your hypercoagulability lab  work that was done in September 2018 was all reassuring.  There is no lab work to suggest that you are at higher risk for blood clot formation. Please use compression stockings if you go on long trips or have extended periods of immobility. When you are traveling try to make sure to get out and walk every 2 hours. Follow with Dr Delton CoombesByrum as needed for any changes in your symptoms.   Levy Pupaobert Dinara Lupu, MD, PhD 08/10/2017, 12:01 PM [facility] [phone] or if no answer [phone]

## 2017-08-10 NOTE — Patient Instructions (Signed)
No indication to restart blood thinning medication at this time. Your hypercoagulability lab work that was done in September 2018 was all reassuring.  There is no lab work to suggest that you are at higher risk for blood clot formation. Please use compression stockings if you go on long trips or have extended periods of immobility. When you are traveling try to make sure to get out and walk every 2 hours. Follow with Dr Delton CoombesByrum as needed for any changes in your symptoms.

## 2017-08-10 NOTE — Assessment & Plan Note (Signed)
Reassuring d-dimer and right lower extremity ultrasound since last time.  Her hypercoagulablity panel from September 2018 was negative.  I do not think we need to repeat it.  she can stay off Xarelto. Discussed strategies to avoid recurrent DVT with her today.  No indication to restart blood thinning medication at this time. Your hypercoagulability lab work that was done in September 2018 was all reassuring.  There is no lab work to suggest that you are at higher risk for blood clot formation. Please use compression stockings if you go on long trips or have extended periods of immobility. When you are traveling try to make sure to get out and walk every 2 hours. Follow with Dr Delton CoombesByrum as needed for any changes in your symptoms.

## 2017-11-06 DIAGNOSIS — G4700 Insomnia, unspecified: Secondary | ICD-10-CM | POA: Diagnosis not present

## 2017-11-11 IMAGING — CR DG FOOT COMPLETE 3+V*L*
3 series · 3 of 3 positions shown · non-contrast
Comparison: None.

CLINICAL DATA: Fell today with pain mid first metatarsal

EXAM:
LEFT FOOT - COMPLETE 3+ VIEW

[x foot ap left]
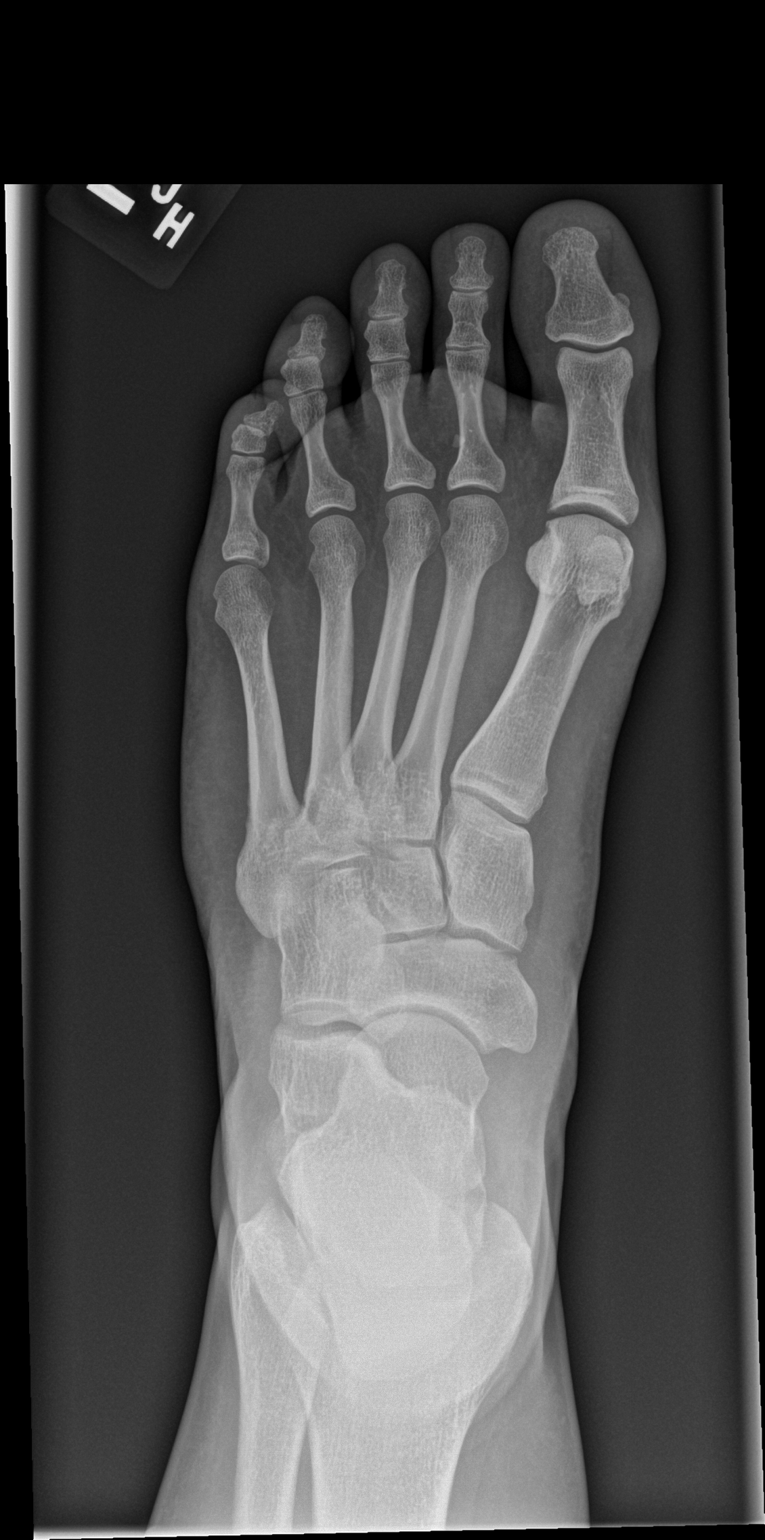

[x foot obl left]
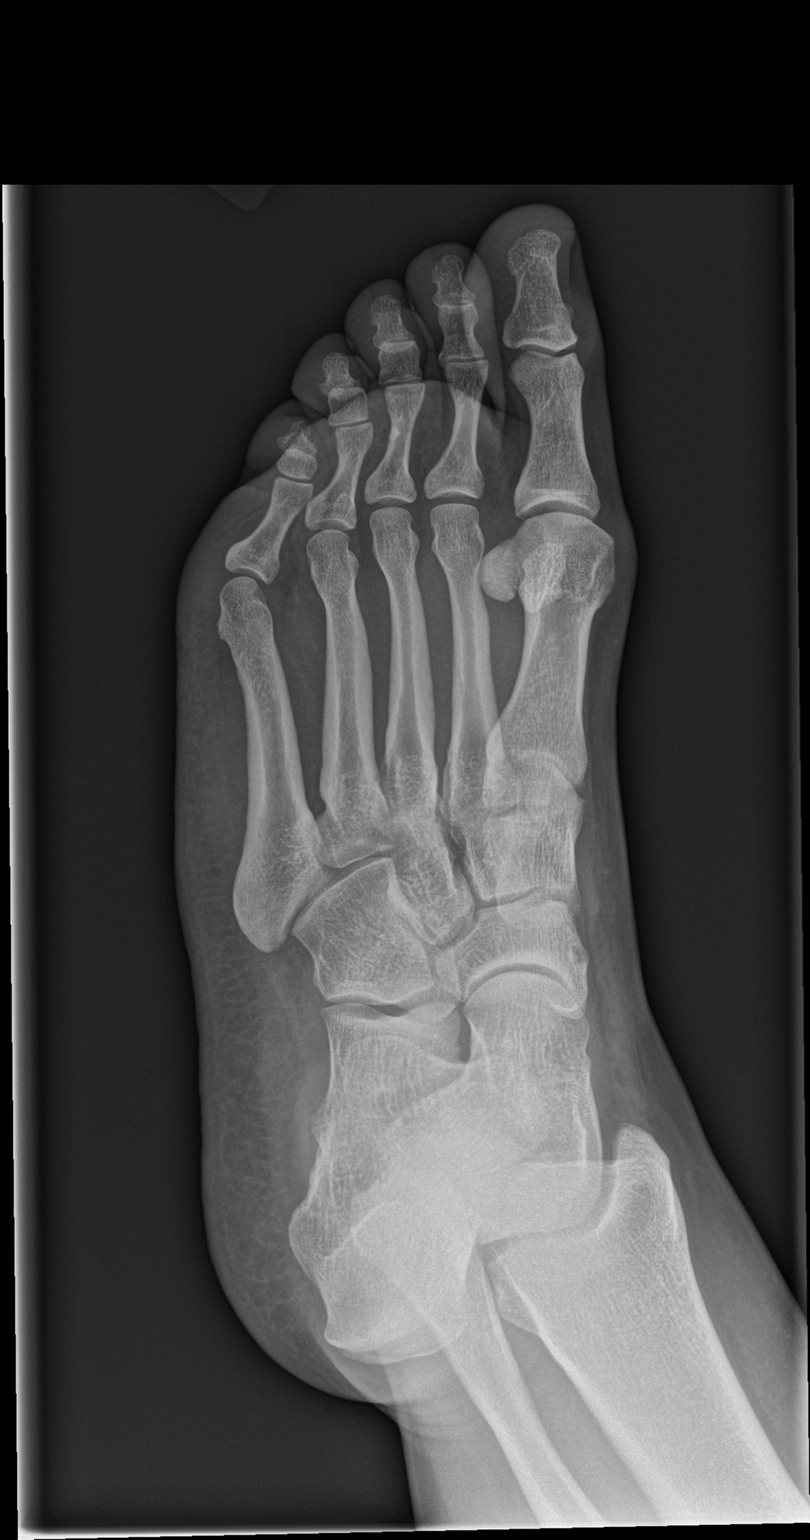

[x foot lat left]
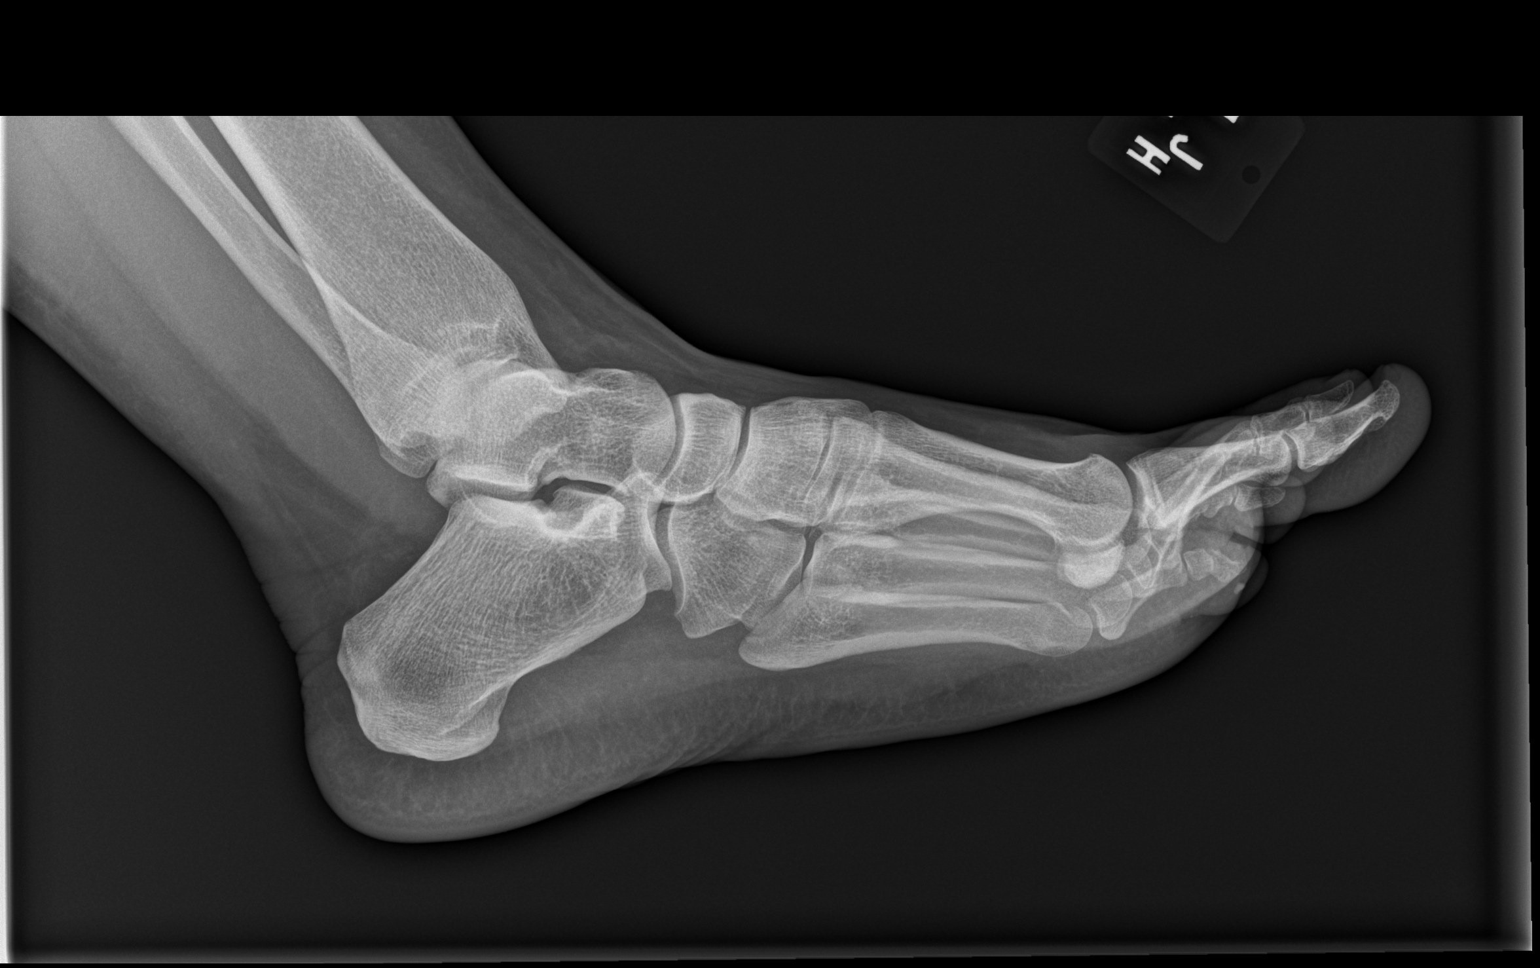

[3 of 3 positions shown; findings below may reference images not displayed]

FINDINGS: Tarsal-metatarsal alignment is normal. No acute fracture is seen. No
erosion is noted.
IMPRESSION: Negative.

## 2017-11-28 IMAGING — CT CT ANGIO CHEST
2 of 6 series · 18 of 36 positions shown · IV contrast (Omni 300)
Comparison: None.

CLINICAL DATA: 36 y/o F; 36 y/o F; left-sided chest pain radiating
to the back.

EXAM:
CT ANGIOGRAPHY CHEST WITH CONTRAST
TECHNIQUE: Multidetector CT imaging of the chest was performed using the
standard protocol during bolus administration of intravenous
contrast. Multiplanar CT image reconstructions and MIPs were
obtained to evaluate the vascular anatomy.
CONTRAST:  61 cc Isovue 370

[Series 7: pe thins · axial · 0.93mm/px · z∈[+1174,+1424]mm · 17 of 282 slices shown]
[im 16/282  lung]
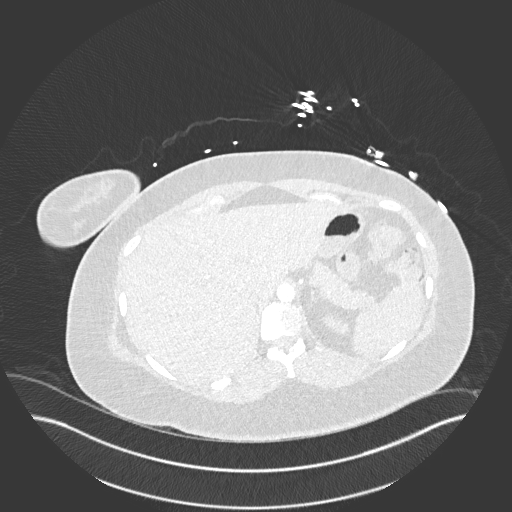
[im 32/282  mediastinal]
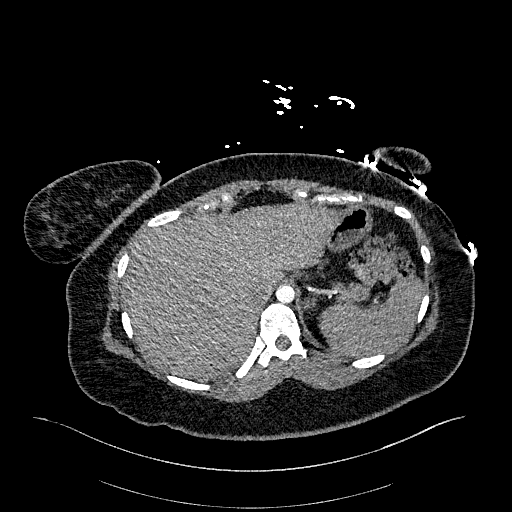
[im 47/282  lung]
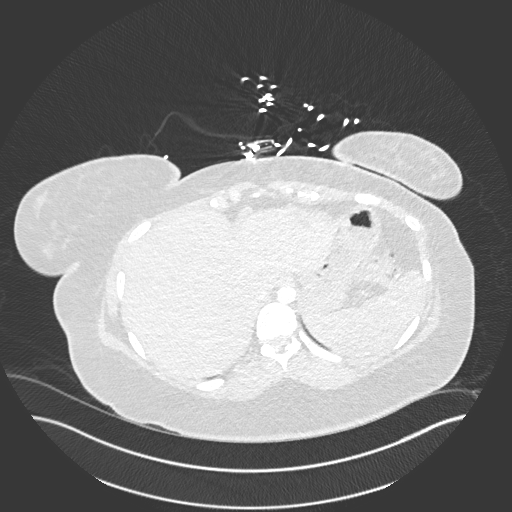
[im 63/282  mediastinal]
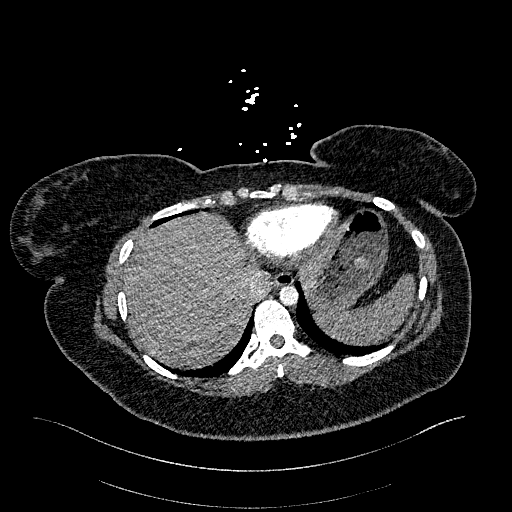
[im 79/282  lung]
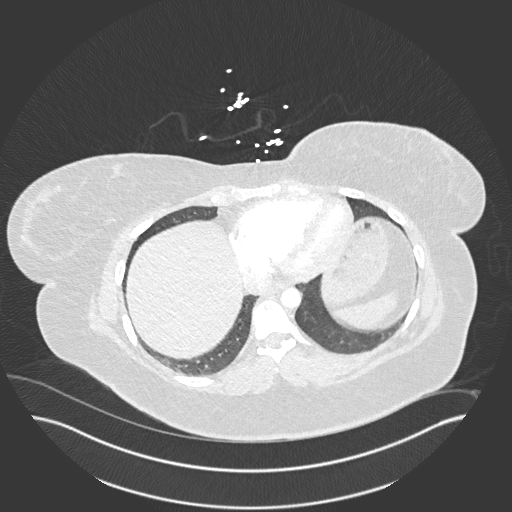
[im 94/282  mediastinal]
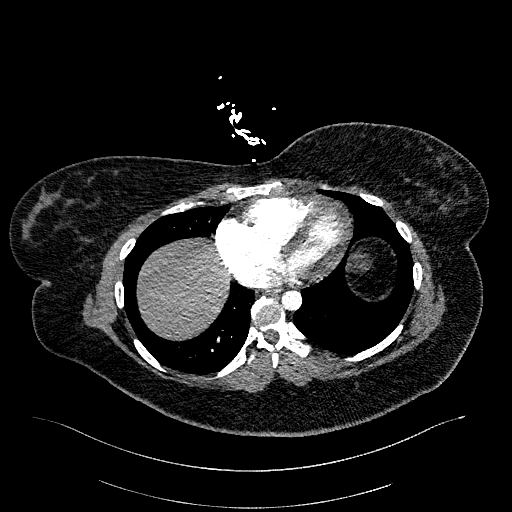
[im 110/282  lung]
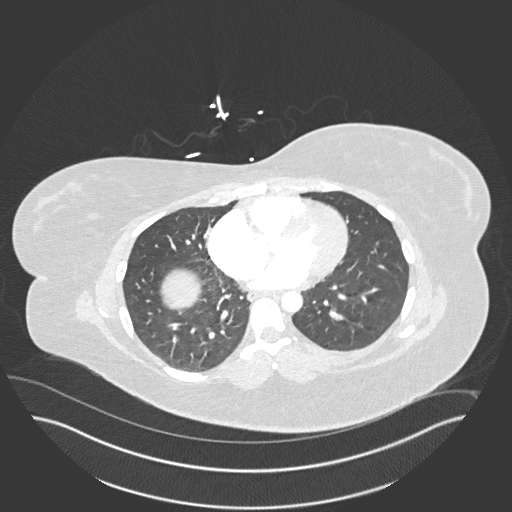
[im 125/282  mediastinal]
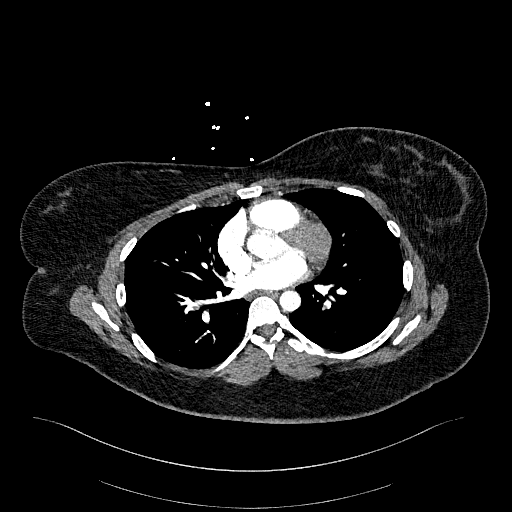
[im 141/282  lung]
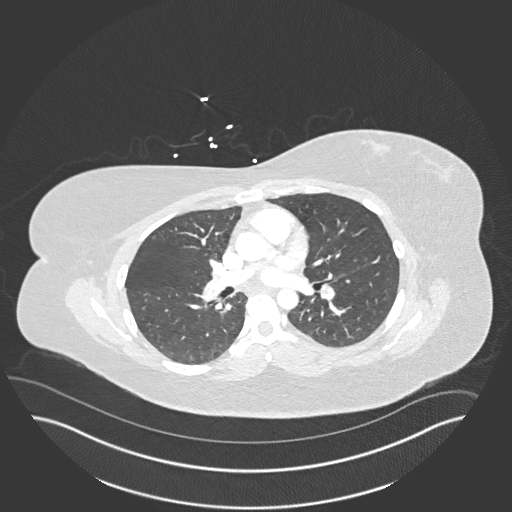
[im 157/282  mediastinal]
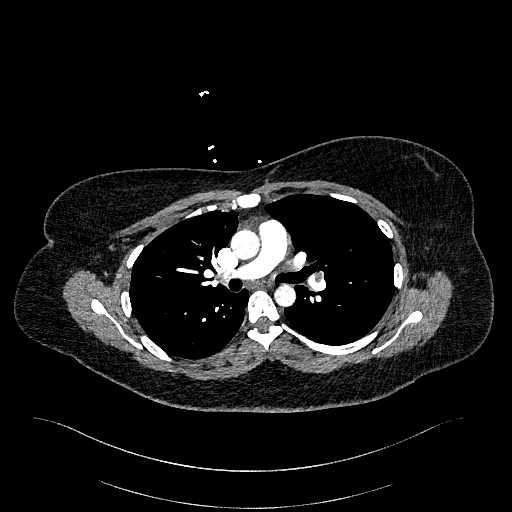
[im 172/282  lung]
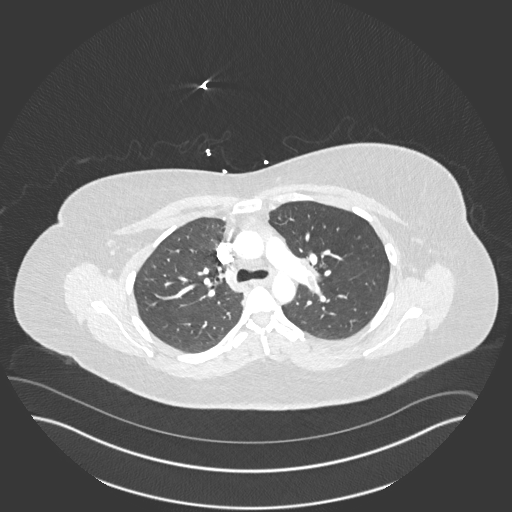
[im 188/282  mediastinal]
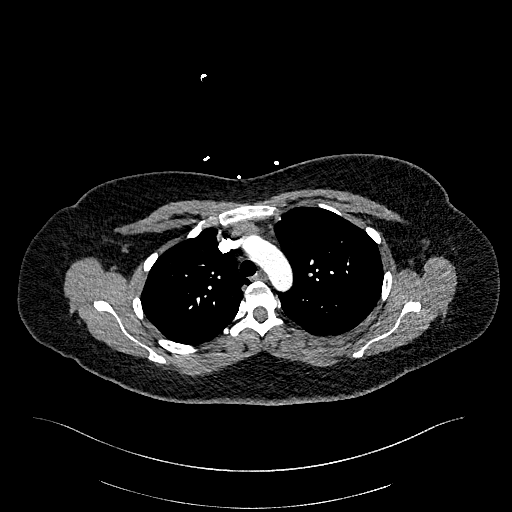
[im 203/282  lung]
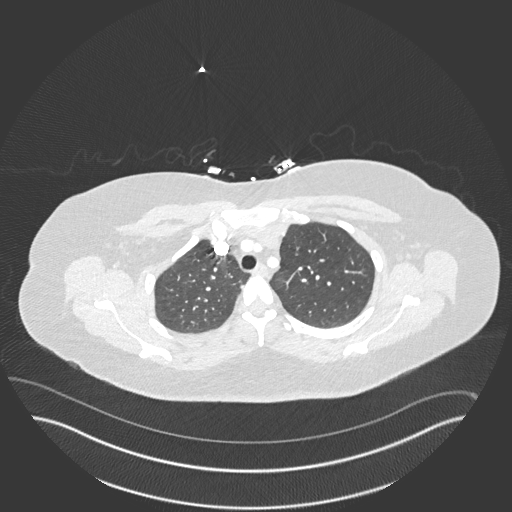
[im 219/282  mediastinal]
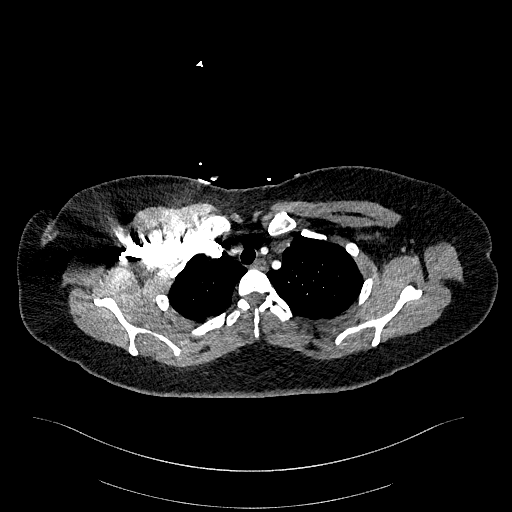
[im 235/282  lung]
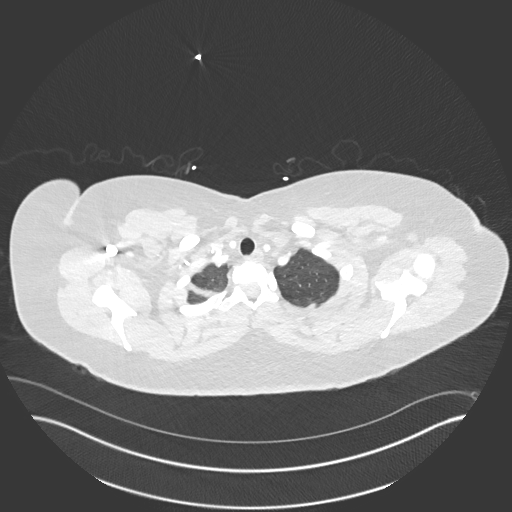
[im 250/282  mediastinal]
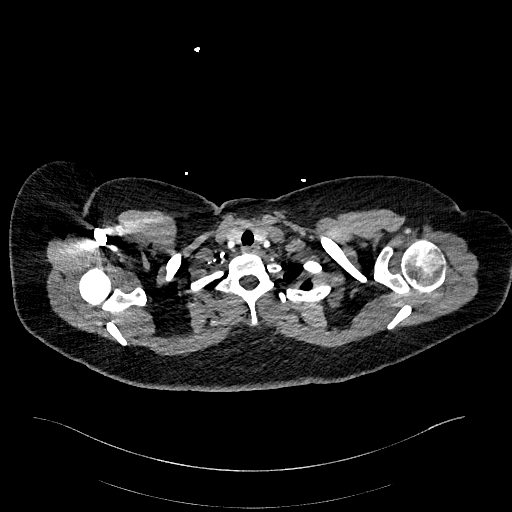
[im 266/282  lung]
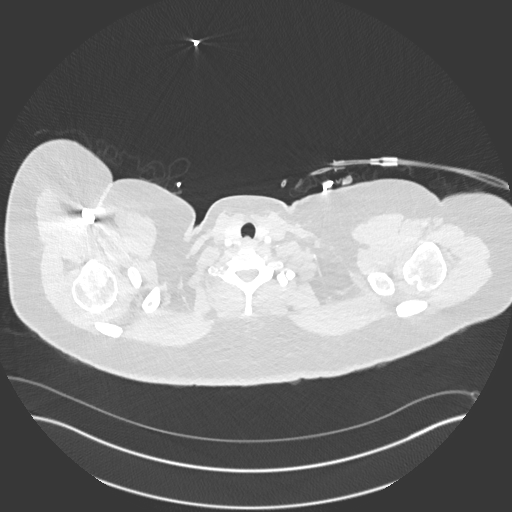

[Series 8: pe 2mm cor · coronal · 0.59mm/px · 1 of 151 slices shown]
[im 76/151  mediastinal]
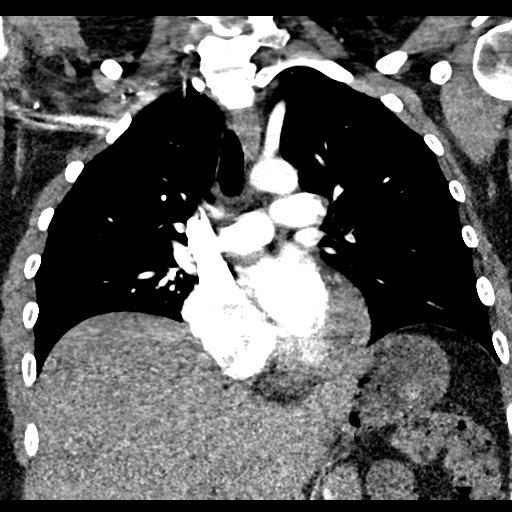

[18 of 36 positions shown; findings below may reference images not displayed]

FINDINGS: Cardiovascular: Extensive bilateral pulmonary embolus, no saddle
embolus. RV/ LV equals 1.5. Normal heart size. No pericardial
effusion. Normal thoracic aorta.

Mediastinum/Nodes: No enlarged mediastinal, hilar, or axillary lymph
nodes. Thyroid gland, trachea, and esophagus demonstrate no
significant findings.

Lungs/Pleura: Lungs are clear. No pleural effusion or pneumothorax.

Upper Abdomen: No acute abnormality.

Musculoskeletal: No chest wall abnormality. No acute or significant
osseous findings.

Review of the MIP images confirms the above findings.
IMPRESSION: Positive for acute PE with CT evidence of right heart strain (RV/LV
Ratio = 1.5) consistent with at least submassive (intermediate risk)
PE. The presence of right heart strain has been associated with an
increased risk of morbidity and mortality. Please activate Code PE
by paging [phone].

These results were called by telephone at the time of interpretation
on 11/16/2016 at [DATE] to Dr. NYA JUMPER , who verbally
acknowledged these results.

By: Laureen Eichler M.D.

## 2018-01-21 DIAGNOSIS — Z13220 Encounter for screening for lipoid disorders: Secondary | ICD-10-CM | POA: Diagnosis not present

## 2018-01-21 DIAGNOSIS — G4700 Insomnia, unspecified: Secondary | ICD-10-CM | POA: Diagnosis not present

## 2018-01-27 DIAGNOSIS — Z01419 Encounter for gynecological examination (general) (routine) without abnormal findings: Secondary | ICD-10-CM | POA: Diagnosis not present

## 2018-03-06 ENCOUNTER — Ambulatory Visit (INDEPENDENT_AMBULATORY_CARE_PROVIDER_SITE_OTHER): Payer: 59

## 2018-03-06 ENCOUNTER — Ambulatory Visit (HOSPITAL_COMMUNITY)
Admission: EM | Admit: 2018-03-06 | Discharge: 2018-03-06 | Disposition: A | Discharge status: Home / Self Care | Payer: 59 | Attending: Family Medicine | Admitting: Family Medicine

## 2018-03-06 ENCOUNTER — Encounter (HOSPITAL_COMMUNITY): Payer: Self-pay

## 2018-03-06 DIAGNOSIS — S0992XA Unspecified injury of nose, initial encounter: Secondary | ICD-10-CM

## 2018-03-06 DIAGNOSIS — R040 Epistaxis: Secondary | ICD-10-CM | POA: Insufficient documentation

## 2018-03-06 DIAGNOSIS — J3489 Other specified disorders of nose and nasal sinuses: Secondary | ICD-10-CM

## 2018-03-06 MED ORDER — HYDROCODONE-ACETAMINOPHEN 5-325 MG PO TABS: 1.0000 | ORAL_TABLET | Freq: Four times a day (QID) | ORAL | 0 refills | Status: DC | PRN

## 2018-03-06 NOTE — ED Triage Notes (Signed)
Pt presents today with nose pain that happened earlier today when trying to unfold a folding table and one of the legs hit her in the face. Nose feels swollen and in pain. Not able to breathe as good does not know if it is due to swelling.

## 2018-03-06 NOTE — Discharge Instructions (Signed)
Be aware, pain medications may cause drowsiness. Please do not drive, operate heavy machinery or make important decisions while on this medication, it can cloud your judgement.  

## 2018-03-08 NOTE — ED Provider Notes (Signed)
[name]   [id number] 03/06/18 Arrival Time: 1014  ASSESSMENT & PLAN:  1. Nasal injury, initial encounter   2. Epistaxis    Even though this was a facial/nasal injury, I discussed post-concussive symptoms and recommended follow up should she begin to experience any. Normal neurologic exam. No suspicion for ICH, SAH, or skull fracture. No indication for neurodiagnostic imaging at this time.  I have personally viewed the imaging studies ordered this visit. No fracture seen.  No recurrent epistaxis. No septal hematoma.  Meds ordered this encounter  Medications  . HYDROcodone-acetaminophen (NORCO/VICODIN) 5-325 MG tablet    Sig: Take 1 tablet by mouth every 6 (six) hours as needed for moderate pain or severe pain.    Dispense:  8 tablet    Refill:  0   [place] Controlled Substances Registry consulted for this patient. I feel the risk/benefit ratio today is favorable for proceeding with this prescription for a controlled substance. Medication sedation precautions given.  Discharge Instructions     Be aware, pain medications may cause drowsiness. Please do not drive, operate heavy machinery or make important decisions while on this medication, it can cloud your judgement.  Will f/u with ENT or here if not seeing significant improvement over the next week, sooner if needed.  Reviewed expectations re: course of current medical issues. Questions answered. Outlined signs and symptoms indicating need for more acute intervention. Patient verbalized understanding. After Visit Summary given.  SUBJECTIVE: History from: patient. Samantha [name] is a 38 y.o. female who presents with complaint of a facial/nasal injury a few hours ago. She reports trying to unfold a folding table when one of the legs "popped" her in the nose. Immediate pain with epistaxis; unsure from which nare. No eye injury. Epistaxis stopped quickly. LOC: no loss of consciousness. Ambulatory since injury.  Reports continued nasal pain that does not limit normal activities. No extremity sensation changes or weakness. No associated n/v. No visual changes. Normal bowel and bladder habits. OTC treatment: has not tried OTCs for relief of pain. Able to breathe through both nares but "a little stuffy because I've also had a cold".  Denies balance or coordination problems, confusion, dizziness, feeling "out of it", headache, nausea, visual changes and vomiting.  ROS: As per HPI. All other systems negative   OBJECTIVE:  Vitals:   03/06/18 1104  BP: 138/63  Pulse: 67  Resp: 14  Temp: 98 F (36.7 C)  TempSrc: Oral  SpO2: 98%    Glascow Coma Scale: 15  General appearance: alert; no distress HEENT: normocephalic; atraumatic; conjunctivae normal; oral mucosa normal; PERRLA; EOMI; dried blood in L nare; no septal hematoma; superficial <1cm lac over bridge of nose with well-approximated edges and no active bleeding; no active epistaxis; more swollen on R side of nose; lips normal Neck: supple with FROM Lungs: clear to auscultation bilaterally Heart: regular rate and rhythm Abdomen: soft, non-tender; no bruising Back: no midline tenderness Extremities: moves all extremities normally Skin: warm and dry Neurologic: normal gait Psychological: alert and cooperative; normal mood and affect   Dg Nasal Bones  Result Date: 03/06/2018 CLINICAL DATA:  Laceration left-sided nose.  Trauma. EXAM: NASAL BONES - 3+ VIEW COMPARISON:  None. FINDINGS: There is no evidence of fracture or other bone abnormality. IMPRESSION: Negative. Electronically Signed   By: Gerome Samavid  Williams III M.D   On: 03/06/2018 12:13    No Known Allergies   Past Medical History:  Diagnosis Date  . Anal fissure   .  Anxiety   . Bilateral bunions   . Bronchitis   . Clotting disorder (HCC)    DVT/PE 2018  . Insomnia   . Leg fracture    Past Surgical History:  Procedure Laterality Date  . LEG SURGERY    . WISDOM TOOTH EXTRACTION       Family History  Problem Relation Age of Onset  . Thyroid disease Mother   . Diabetes Father   . Cancer Paternal Grandfather        skin  . Colon cancer Neg Hx   . Esophageal cancer Neg Hx   . Stomach cancer Neg Hx   . Rectal cancer Neg Hx        Mardella Layman, MD 03/08/18 [phone]

## 2018-08-13 ENCOUNTER — Other Ambulatory Visit (HOSPITAL_COMMUNITY): Payer: Self-pay | Admitting: Sports Medicine

## 2018-08-13 ENCOUNTER — Ambulatory Visit (HOSPITAL_COMMUNITY)
Admission: RE | Admit: 2018-08-13 | Discharge: 2018-08-13 | Disposition: A | Discharge status: Home / Self Care | Payer: 59 | Source: Ambulatory Visit | Attending: Cardiology | Admitting: Cardiology

## 2018-08-13 ENCOUNTER — Other Ambulatory Visit: Payer: Self-pay

## 2018-08-13 DIAGNOSIS — M79605 Pain in left leg: Secondary | ICD-10-CM | POA: Insufficient documentation

## 2019-02-16 ENCOUNTER — Other Ambulatory Visit: Payer: Self-pay | Admitting: Obstetrics and Gynecology

## 2019-02-16 DIAGNOSIS — Z1231 Encounter for screening mammogram for malignant neoplasm of breast: Secondary | ICD-10-CM

## 2019-03-24 ENCOUNTER — Other Ambulatory Visit: Payer: Self-pay

## 2019-03-24 ENCOUNTER — Ambulatory Visit
Admission: RE | Admit: 2019-03-24 | Discharge: 2019-03-24 | Disposition: A | Discharge status: Home / Self Care | Payer: 59 | Source: Ambulatory Visit | Attending: Obstetrics and Gynecology | Admitting: Obstetrics and Gynecology

## 2019-03-24 DIAGNOSIS — Z1231 Encounter for screening mammogram for malignant neoplasm of breast: Secondary | ICD-10-CM

## 2019-04-04 ENCOUNTER — Telehealth: Payer: Self-pay | Admitting: Gastroenterology

## 2019-04-04 NOTE — Telephone Encounter (Signed)
Pt is scheduled for OV and requested a refill for nitroglycerine ointment.

## 2019-04-06 ENCOUNTER — Telehealth: Payer: Self-pay | Admitting: Gastroenterology

## 2019-04-06 MED ORDER — AMBULATORY NON FORMULARY MEDICATION: 1 refills | Status: DC

## 2019-04-06 NOTE — Telephone Encounter (Signed)
Refill sent to pharmacy.   

## 2019-04-06 NOTE — Telephone Encounter (Signed)
Script has been faxed to the correct pharmacy.

## 2019-04-06 NOTE — Telephone Encounter (Signed)
Pt stated that nitroglycerin compound should be sent to [facility].

## 2019-04-11 ENCOUNTER — Ambulatory Visit: Payer: 59 | Admitting: Gastroenterology

## 2019-04-13 ENCOUNTER — Ambulatory Visit: Payer: 59 | Admitting: Gastroenterology

## 2019-04-13 ENCOUNTER — Encounter: Payer: Self-pay | Admitting: Gastroenterology

## 2019-04-13 ENCOUNTER — Other Ambulatory Visit: Payer: Self-pay

## 2019-04-13 VITALS — BP 118/76 | HR 88 | Temp 98.5°F | Ht 67.25 in | Wt 275.0 lb

## 2019-04-13 DIAGNOSIS — K625 Hemorrhage of anus and rectum: Secondary | ICD-10-CM | POA: Diagnosis not present

## 2019-04-13 DIAGNOSIS — K602 Anal fissure, unspecified: Secondary | ICD-10-CM

## 2019-04-13 DIAGNOSIS — K6289 Other specified diseases of anus and rectum: Secondary | ICD-10-CM

## 2019-04-13 NOTE — Patient Instructions (Addendum)
If you are age 40 or older, your body mass index should be between 23-30. Your Body mass index is 42.75 kg/m. If this is out of the aforementioned range listed, please consider follow up with your Primary Care Provider.  If you are age 85 or younger, your body mass index should be between 19-25. Your Body mass index is 42.75 kg/m. If this is out of the aformentioned range listed, please consider follow up with your Primary Care Provider.   Call us when you need a refill for Nitroglycerin gel again.   Follow up as needed.   Thank you for choosing me and [facility].  Doug Sou, PA

## 2019-04-13 NOTE — Progress Notes (Signed)
04/13/2019 Samantha [name] [id number] [birth year]   HISTORY OF PRESENT ILLNESS: This is a pleasant 39 year old female who is here today with complaints of rectal bleeding and rectal pain.  She had similar complaints in 2018 was treated for anal fissure.  She also underwent flex sig with Dr. Rhea Belton and that was otherwise unremarkable.  That healed and she did well for a long time.  She says that recently she had a hard stool and believes that she caused the same issue again.  Says that symptoms feel exactly the same as it had previously.  Is seeing small amounts of bright red blood on the toilet paper upon wiping.  Having the same type of rectal pain.  She called in last week for refill on her nitro gel.  Was sent to her pharmacy with no refills and she was advised that she needed an office visit for further refills on the medication.  She says that even since using the medication since last week she has already had improvement.   Past Medical History:  Diagnosis Date  . Anal fissure   . Anxiety   . Bilateral bunions   . Bronchitis   . Clotting disorder (HCC)    DVT/PE 2018  . Insomnia   . Leg fracture    Past Surgical History:  Procedure Laterality Date  . LEG SURGERY    . WISDOM TOOTH EXTRACTION      reports that she has never smoked. She has never used smokeless tobacco. She reports current alcohol use. She reports that she does not use drugs. family history includes Cancer in her paternal grandfather; Diabetes in her father; Thyroid disease in her mother. No Known Allergies    Outpatient Encounter Medications as of 04/13/2019  Medication Sig  . AMBULATORY NON FORMULARY MEDICATION Nitroglycerine ointment 0.125 %  Apply a pea sized amount internally four times daily. Dispense 30 GM zero refill  . cyclobenzaprine (FLEXERIL) 10 MG tablet Take 10 mg by mouth 3 (three) times daily as needed for muscle spasms.  . diphenhydrAMINE (BENADRYL) 25 MG tablet Take 25 mg by  mouth at bedtime.  . Homeopathic Products (ZICAM ALLERGY RELIEF NA) Place into the nose.  . ibuprofen (ADVIL) 800 MG tablet Take 800 mg by mouth 3 (three) times daily.  Marland Kitchen LEXAPRO 20 MG tablet Take 20 mg by mouth daily.   . Melatonin 10 MG TABS Take 10 mg by mouth at bedtime.   . Multiple Vitamins-Minerals (MULTIVITAMIN WITH MINERALS) tablet Take 1 tablet by mouth daily.  . traZODone (DESYREL) 50 MG tablet Take 50 mg by mouth at bedtime.  . [DISCONTINUED] HYDROcodone-acetaminophen (NORCO/VICODIN) 5-325 MG tablet Take 1 tablet by mouth every 6 (six) hours as needed for moderate pain or severe pain.   Facility-Administered Encounter Medications as of 04/13/2019  Medication  . 0.9 %  sodium chloride infusion     REVIEW OF SYSTEMS  : All other systems reviewed and negative except where noted in the History of Present Illness.   PHYSICAL EXAM: BP 118/76 (BP Location: Left Arm, Patient Position: Sitting, Cuff Size: Large)   Pulse 88   Temp 98.5 F (36.9 C)   Ht 5' 7.25" (1.708 m) Comment: height measured without shoes  Wt 275 lb (124.7 kg)   LMP 04/08/2019   BMI 42.75 kg/m  General: Well developed white female in no acute distress Head: Normocephalic and atraumatic Eyes:  Sclerae anicteric, conjunctiva pink. Ears: Normal auditory acuity Lungs: Clear throughout to auscultation;  no increased WOB. Heart: Regular rate and rhythm; no M/R/G. Abdomen: Soft, non-distended.  BS present.  Non-tender. Musculoskeletal: Symmetrical with no gross deformities  Skin: No lesions on visible extremities Extremities: No edema  Neurological: Alert oriented x 4, grossly non-focal Psychological:  Alert and cooperative. Normal mood and affect  ASSESSMENT AND PLAN: *Rectal bleeding and rectal pain that began after having hard stool.  Symptoms feel exactly the same as they had in the past when she was treated for anal fissure.  Flex sig in 2018 otherwise negative.  She's had improvement in her symptoms since  restarting the nitro gel last week.  She will continue to use this for a total of about 8 weeks.  Was advised to keep her stools soft.  Is now using Metamucil fiber supplement daily.  Advised on sitz bath color etc.  Will follow up as needed for any further issues.   CC:  Lois Huxley, [place]

## 2019-04-24 ENCOUNTER — Ambulatory Visit: Payer: 59 | Attending: Internal Medicine

## 2019-04-24 DIAGNOSIS — Z23 Encounter for immunization: Secondary | ICD-10-CM

## 2019-04-24 NOTE — Progress Notes (Signed)
   Covid-19 Vaccination Clinic  Name:  Samantha [name]    MRN: [id number] DOB: 05-05-1980  04/24/2019  [name] Samantha Dane was observed post Covid-19 immunization for 15 minutes without incident. She was provided with Vaccine Information Sheet and instruction to access the V-Safe system.   Ms. Samantha [name] was instructed to call 911 with any severe reactions post vaccine: Marland Kitchen Difficulty breathing  . Swelling of face and throat  . A fast heartbeat  . A bad rash all over body  . Dizziness and weakness   Immunizations Administered    Name Date Dose VIS Date Route   Pfizer COVID-19 Vaccine 04/24/2019 12:18 PM 0.3 mL 01/28/2019 Intramuscular   Manufacturer: [employer], [employer]   Lot: [id number]   NDC: [address]-8

## 2019-04-26 NOTE — Progress Notes (Signed)
Addendum: Reviewed and agree with assessment and management plan. Zeyna Mkrtchyan M, MD  

## 2019-05-25 ENCOUNTER — Ambulatory Visit: Payer: 59

## 2019-11-22 ENCOUNTER — Ambulatory Visit: Payer: 59 | Attending: Internal Medicine

## 2019-11-22 DIAGNOSIS — Z23 Encounter for immunization: Secondary | ICD-10-CM

## 2019-11-22 NOTE — Progress Notes (Signed)
   Covid-19 Vaccination Clinic  Name:  Geonna Lockyer    MRN: [id number] DOB: Jun 29, 1980  11/22/2019  Ms. Lehmert Carmelina Dane was observed post Covid-19 immunization for 15 minutes without incident. She was provided with Vaccine Information Sheet and instruction to access the V-Safe system.   Ms. Vaneza Pickart was instructed to call 911 with any severe reactions post vaccine: Marland Kitchen Difficulty breathing  . Swelling of face and throat  . A fast heartbeat  . A bad rash all over body  . Dizziness and weakness

## 2020-03-19 ENCOUNTER — Other Ambulatory Visit: Payer: Self-pay | Admitting: Obstetrics and Gynecology

## 2020-03-19 DIAGNOSIS — Z1231 Encounter for screening mammogram for malignant neoplasm of breast: Secondary | ICD-10-CM

## 2020-03-28 ENCOUNTER — Ambulatory Visit: Payer: Self-pay | Admitting: Podiatry

## 2020-04-02 ENCOUNTER — Ambulatory Visit: Payer: 59 | Admitting: Podiatry

## 2020-04-02 ENCOUNTER — Other Ambulatory Visit: Payer: Self-pay

## 2020-04-02 DIAGNOSIS — L600 Ingrowing nail: Secondary | ICD-10-CM

## 2020-04-02 MED ORDER — GENTAMICIN SULFATE 0.1 % EX CREA: 1.0000 "application " | TOPICAL_CREAM | Freq: Two times a day (BID) | CUTANEOUS | 1 refills | Status: DC

## 2020-04-02 NOTE — Progress Notes (Signed)
   Subjective: Patient presents today for evaluation of pain to the medial border right great toe. Patient is concerned for possible ingrown nail.  She states that approximately 30 yrs ago she had ingrown toenail procedures performed when she was approximately 40 years old.  She states that over the last 30 years she has developed ingrown's off and on intermittently.   Patient presents today for further treatment and evaluation.  Past Medical History:  Diagnosis Date  . Anal fissure   . Anxiety   . Bilateral bunions   . Bronchitis   . Clotting disorder (HCC)    DVT/PE 2018  . Insomnia   . Leg fracture     Objective:  General: Well developed, nourished, in no acute distress, alert and oriented x3   Dermatology: Skin is warm, dry and supple bilateral.  Medial border right great toe appears to be erythematous with evidence of an ingrowing nail. Pain on palpation noted to the border of the nail fold. The remaining nails appear unremarkable at this time. There are no open sores, lesions.  Vascular: Dorsalis Pedis artery and Posterior Tibial artery pedal pulses palpable. No lower extremity edema noted.   Neruologic: Grossly intact via light touch bilateral.  Musculoskeletal: Muscular strength within normal limits in all groups bilateral. Normal range of motion noted to all pedal and ankle joints.   Assesement: #1 Paronychia with ingrowing nail medial border right great toe #2 Pain in toe  Plan of Care:  1. Patient evaluated.  2. Discussed treatment alternatives and plan of care. Explained nail avulsion procedure and post procedure course to patient. 3. Patient opted for permanent partial nail avulsion of the medial border right great toe.  4. Prior to procedure, local anesthesia infiltration utilized using 3 ml of a 50:50 mixture of 2% plain lidocaine and 0.5% plain marcaine in a normal hallux block fashion and a betadine prep performed.  5. Partial permanent nail avulsion with  chemical matrixectomy performed using 3x30sec applications of phenol followed by alcohol flush.  6. Light dressing applied. 7.  Prescription for gentamicin cream applied daily  8.  Return to clinic 2 weeks.  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   Felecia Shelling, DPM [facility]  Dr. Felecia Shelling, DPM    [address] [place], [place] [address]                Office [phone]  Fax [phone]

## 2020-04-16 ENCOUNTER — Other Ambulatory Visit: Payer: Self-pay

## 2020-04-16 ENCOUNTER — Ambulatory Visit (INDEPENDENT_AMBULATORY_CARE_PROVIDER_SITE_OTHER): Payer: 59 | Admitting: Podiatry

## 2020-04-16 DIAGNOSIS — L600 Ingrowing nail: Secondary | ICD-10-CM

## 2020-04-16 NOTE — Progress Notes (Signed)
   Subjective: 40 y.o. female presents today status post permanent nail avulsion procedure of the medial border of the right great toe that was performed on 04/02/2020.  Patient states that overall she feels significant improvement.  She is doing well and has been soaking her feet as instructed and applying antibiotic cream.  No new complaints at this time  Past Medical History:  Diagnosis Date  . Anal fissure   . Anxiety   . Bilateral bunions   . Bronchitis   . Clotting disorder (HCC)    DVT/PE 2018  . Insomnia   . Leg fracture     Objective: Skin is warm, dry and supple. Nail and respective nail fold appears to be healing appropriately. Open wound to the associated nail fold with a granular wound base and moderate amount of fibrotic tissue. Minimal drainage noted. Mild erythema around the periungual region likely due to phenol chemical matricectomy.  Assessment: #1 postop permanent partial nail avulsion medial border right great toe #2 open wound periungual nail fold of respective digit.   Plan of care: #1 patient was evaluated  #2 debridement of open wound was performed to the periungual border of the respective toe using a currette. Antibiotic ointment and Band-Aid was applied. #3 patient is to return to clinic on a PRN basis.  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   Felecia Shelling, DPM [facility]  Dr. Felecia Shelling, DPM    [address] [place], [place] [address]                Office [phone]  Fax [phone]

## 2020-05-01 ENCOUNTER — Ambulatory Visit
Admission: RE | Admit: 2020-05-01 | Discharge: 2020-05-01 | Disposition: A | Discharge status: Home / Self Care | Payer: 59 | Source: Ambulatory Visit | Attending: Obstetrics and Gynecology | Admitting: Obstetrics and Gynecology

## 2020-05-01 ENCOUNTER — Other Ambulatory Visit: Payer: Self-pay

## 2020-05-01 DIAGNOSIS — Z1231 Encounter for screening mammogram for malignant neoplasm of breast: Secondary | ICD-10-CM

## 2020-11-07 ENCOUNTER — Ambulatory Visit: Payer: 59 | Admitting: Podiatry

## 2020-11-07 ENCOUNTER — Ambulatory Visit (INDEPENDENT_AMBULATORY_CARE_PROVIDER_SITE_OTHER): Payer: 59

## 2020-11-07 ENCOUNTER — Other Ambulatory Visit: Payer: Self-pay

## 2020-11-07 DIAGNOSIS — M2041 Other hammer toe(s) (acquired), right foot: Secondary | ICD-10-CM | POA: Diagnosis not present

## 2020-11-07 DIAGNOSIS — M2042 Other hammer toe(s) (acquired), left foot: Secondary | ICD-10-CM

## 2020-11-09 NOTE — Progress Notes (Signed)
   HPI: 40 y.o. female presenting today for evaluation of pain and tenderness associated to the fifth toes bilaterally.  Patient states that she believes she has hammertoes to the fifth toes which are causing a lot of discomfort when walking.  She also rides a bike for exercise outdoors and it causes pain as well.  She would like to have them evaluated today.  Currently she has not done anything for treatment  Past Medical History:  Diagnosis Date   Anal fissure    Anxiety    Bilateral bunions    Bronchitis    Clotting disorder (HCC)    DVT/PE 2018   Insomnia    Leg fracture       Objective: Physical Exam General: The patient is alert and oriented x3 in no acute distress.  Dermatology: Skin is cool, dry and supple bilateral lower extremities. Negative for open lesions or macerations.  Vascular: Palpable pedal pulses bilaterally. No edema or erythema noted. Capillary refill within normal limits.  Neurological: Epicritic and protective threshold grossly intact bilaterally.   Musculoskeletal Exam: All pedal and ankle joints range of motion within normal limits bilateral. Muscle strength 5/5 in all groups bilateral. Hammertoe contracture deformity noted to digits fifth of the bilateral foot.  Radiographic Exam: Adductovarus hammertoe deformity noted to the fifth digit bilateral.  No fractures identified.  Joint spaces preserved.   Assessment: 1.  Adductovarus hammertoe deformity fifth digits bilateral   Plan of Care:  1. Patient evaluated. X-Rays reviewed.  2.  Today we discussed both conservative and surgical options for the patient.  At the moment we are going to pursue conservative treatment modalities 3.  Recommend OTC power step insoles to help alleviate pressure from the forefoot and support the arches 4.  Silicone toe caps were provided for the patient to help cushion the fifth toes bilateral 5.  Return to clinic as needed    Felecia Shelling, DPM [facility]  Dr. Felecia Shelling, DPM    [address] [place], [place] [address]                Office [phone]  Fax [phone]

## 2021-03-19 ENCOUNTER — Other Ambulatory Visit: Payer: Self-pay | Admitting: Obstetrics and Gynecology

## 2021-03-19 DIAGNOSIS — Z1231 Encounter for screening mammogram for malignant neoplasm of breast: Secondary | ICD-10-CM

## 2021-04-08 ENCOUNTER — Other Ambulatory Visit: Payer: Self-pay | Admitting: Sports Medicine

## 2021-04-08 ENCOUNTER — Other Ambulatory Visit: Payer: Self-pay

## 2021-04-08 ENCOUNTER — Ambulatory Visit
Admission: RE | Admit: 2021-04-08 | Discharge: 2021-04-08 | Disposition: A | Discharge status: Home / Self Care | Payer: 59 | Source: Ambulatory Visit | Attending: Sports Medicine | Admitting: Sports Medicine

## 2021-04-08 DIAGNOSIS — R52 Pain, unspecified: Secondary | ICD-10-CM

## 2021-05-01 ENCOUNTER — Ambulatory Visit
Admission: RE | Admit: 2021-05-01 | Discharge: 2021-05-01 | Disposition: A | Discharge status: Home / Self Care | Payer: 59 | Source: Ambulatory Visit | Attending: Obstetrics and Gynecology | Admitting: Obstetrics and Gynecology

## 2021-05-01 DIAGNOSIS — Z1231 Encounter for screening mammogram for malignant neoplasm of breast: Secondary | ICD-10-CM

## 2021-05-02 ENCOUNTER — Ambulatory Visit: Payer: 59

## 2021-05-02 ENCOUNTER — Ambulatory Visit
Admission: RE | Admit: 2021-05-02 | Discharge: 2021-05-02 | Disposition: A | Discharge status: Home / Self Care | Payer: 59 | Source: Ambulatory Visit | Attending: Obstetrics and Gynecology | Admitting: Obstetrics and Gynecology

## 2021-05-06 ENCOUNTER — Other Ambulatory Visit: Payer: Self-pay | Admitting: Obstetrics and Gynecology

## 2021-05-06 DIAGNOSIS — R928 Other abnormal and inconclusive findings on diagnostic imaging of breast: Secondary | ICD-10-CM

## 2021-05-18 ENCOUNTER — Other Ambulatory Visit: Payer: 59

## 2021-05-20 ENCOUNTER — Ambulatory Visit
Admission: RE | Admit: 2021-05-20 | Discharge: 2021-05-20 | Disposition: A | Discharge status: Home / Self Care | Payer: 59 | Source: Ambulatory Visit | Attending: Obstetrics and Gynecology | Admitting: Obstetrics and Gynecology

## 2021-05-20 ENCOUNTER — Other Ambulatory Visit: Payer: Self-pay | Admitting: Obstetrics and Gynecology

## 2021-05-20 DIAGNOSIS — R928 Other abnormal and inconclusive findings on diagnostic imaging of breast: Secondary | ICD-10-CM

## 2021-05-20 DIAGNOSIS — N6320 Unspecified lump in the left breast, unspecified quadrant: Secondary | ICD-10-CM

## 2021-06-14 ENCOUNTER — Other Ambulatory Visit: Payer: Self-pay | Admitting: Family Medicine

## 2021-06-14 DIAGNOSIS — M5442 Lumbago with sciatica, left side: Secondary | ICD-10-CM

## 2021-06-28 ENCOUNTER — Other Ambulatory Visit (INDEPENDENT_AMBULATORY_CARE_PROVIDER_SITE_OTHER): Payer: 59

## 2021-06-28 ENCOUNTER — Encounter: Payer: Self-pay | Admitting: Gastroenterology

## 2021-06-28 ENCOUNTER — Ambulatory Visit: Payer: 59 | Admitting: Gastroenterology

## 2021-06-28 VITALS — BP 114/78 | HR 75 | Ht 67.0 in | Wt 302.0 lb

## 2021-06-28 DIAGNOSIS — R1084 Generalized abdominal pain: Secondary | ICD-10-CM

## 2021-06-28 DIAGNOSIS — K602 Anal fissure, unspecified: Secondary | ICD-10-CM

## 2021-06-28 DIAGNOSIS — K625 Hemorrhage of anus and rectum: Secondary | ICD-10-CM

## 2021-06-28 DIAGNOSIS — R194 Change in bowel habit: Secondary | ICD-10-CM | POA: Insufficient documentation

## 2021-06-28 LAB — T4, FREE: Free T4: 0.71 ng/dL (ref 0.60–1.60)

## 2021-06-28 LAB — T3, FREE: T3, Free: 2.6 pg/mL (ref 2.3–4.2)

## 2021-06-28 MED ORDER — HYOSCYAMINE SULFATE SL 0.125 MG SL SUBL: 1.0000 | SUBLINGUAL_TABLET | Freq: Four times a day (QID) | SUBLINGUAL | 1 refills | Status: DC | PRN

## 2021-06-28 MED ORDER — AMBULATORY NON FORMULARY MEDICATION: 0 refills | Status: DC

## 2021-06-28 NOTE — Patient Instructions (Addendum)
You are scheduled for a follow up with Doug Sou, PA-C on August 13, 2021 at 9:15 am. ?___________________________________________________ ? ?We have sent the following medications to your pharmacy for you to pick up at your convenience: ?Levsin 0.125 mg every 6 hours as needed ?___________________________________________________ ? ?We have sent a prescription for nitroglycerin 0.125%/lidocaine gel to [facility]. You should apply a pea size amount to your rectum three times daily x 6-8 weeks. ? ?[facility]'s information is below: ?Address: [address], [place], [place] [address]  ?[phone] ? ?*Please DO NOT go directly from our office to pick up this medication! Give the pharmacy 1 day to process the prescription as this is compounded and takes time to make. ?____________________________________________________ ? ?Please purchase the following medications over the counter and take as directed: ?Benefiber 2 teaspoons daily in at least 8 ounces water/juice. You may increase to twice daily dosing if desired. ?____________________________________________________ ? ?Your provider has requested that you go to the basement level for lab work before leaving today. Press "B" on the elevator. The lab is located at the first door on the left as you exit the elevator. ? ?____________________________________________________ ? ?Please follow a lactose free diet (See below). ? ?____________________________________________________ ? ?We have given you a FODMAP diet to look over and follow. ? ?____________________________________________________ ? ?If you are age 32 or older, your body mass index should be between 23-30. Your Body mass index is 47.3 kg/m?Marland Kitchen If this is out of the aforementioned range listed, please consider follow up with your Primary Care Provider. ? ?If you are age 41 or younger, your body mass index should be between 19-25. Your Body mass index is 47.3 kg/m?Marland Kitchen If this is out of the  aformentioned range listed, please consider follow up with your Primary Care Provider.  ? ?_____________________________________________________ ?The [place] [facility] providers would like to encourage you to use [facility] to communicate with providers for non-urgent requests or questions.  Due to long hold times on the telephone, sending your provider a message by [facility] may be a faster and more efficient way to get a response.  Please allow 48 business hours for a response.  Please remember that this is for non-urgent requests.  ?_____________________________________________________ ? ?Due to recent changes in healthcare laws, you may see the results of your imaging and laboratory studies on MyChart before your provider has had a chance to review them.  We understand that in some cases there may be results that are confusing or concerning to you. Not all laboratory results come back in the same time frame and the provider may be waiting for multiple results in order to interpret others.  Please give [place] 48 hours in order for your provider to thoroughly review all the results before contacting the office for clarification of your results.  ?_____________________________________________________ ? ?Lactose-Free Diet, Adult ?If you have lactose intolerance, you are not able to digest lactose. Lactose is a natural sugar found mainly in dairy milk and dairy products. A lactose-free diet can help you avoid foods and beverages that contain lactose. ?What are tips for following this plan? ?Reading food labels ?Do not consume foods, beverages, vitamins, minerals, or medicines containing lactose. Read ingredient lists carefully. ?Look for the words "lactose-free" on labels. ?Meal planning ?Use alternatives to dairy milk and foods made with milk products. These include the following: ?Lactose-free milk. ?Soy milk with added calcium and vitamin D. ?Almond milk, coconut milk, rice milk, or other nondairy milk alternatives with added  calcium  and vitamin D. Note that a lot of these are low in protein. ?Soy products, such as soy yogurt, soy cheese, soy ice cream, and soy-based sour cream. ?Other nut milk products, such as almond yogurt, almond cheese, cashew yogurt, cashew cheese, cashew ice cream, coconut yogurt, and coconut ice cream. ?Medicines, vitamins, and supplements ?Use lactase enzyme drops or tablets as directed by your health care provider. ?Make sure you get enough calcium and vitamin D in your diet. A lactose-free eating plan can be lacking in these important nutrients. ?Take calcium and vitamin D supplements as directed by your health care provider. Talk with your health care provider about supplements if you are not able to get enough calcium and vitamin D from food. ?What foods should I eat? ? ?Fruits ?All fresh, canned, frozen, or dried fruits and fruit juices that are not processed with lactose. ?Vegetables ?All fresh, frozen, and canned vegetables without cheese, cream, or butter sauces. ?Grains ?Any that are not made with dairy milk or dairy products. ?Meats and other proteins ?Any meat, fish, poultry, and other protein sources that are not made with dairy milk or dairy products. ?Fats and oils ?Any that are not made with dairy milk or dairy products. ?Sweets and desserts ?Any that are not made with dairy milk or dairy products. ?Seasonings and condiments ?Any that are not made with dairy milk or dairy products. ?Calcium ?Calcium is found in many foods that contain lactose and is important for bone health. The amount of calcium you need depends on your age: ?Adults younger than 50 years: 1,000 mg of calcium a day. ?Adults older than 50 years: 1,200 mg of calcium a day. ?If you are not getting enough calcium, you may get it from other sources, including: ?Orange juice that has been fortified with calcium. This means that calcium has been added to the product. There are 300-350 mg of calcium in 1 cup (237 mL) of calcium-fortified  orange juice. ?Soy milk fortified with calcium. There are 300-400 mg of calcium in 1 cup (237 mL) of calcium-fortified soy milk. ?Rice or almond milk fortified with calcium. There are 300 mg of calcium in 1 cup (237 mL) of calcium-fortified rice or almond milk. ?Breakfast cereals fortified with calcium. There are 100-1,000 mg of calcium in calcium-fortified breakfast cereals. ?Spinach, cooked. There are 145 mg of calcium in ? cup (90 g) of cooked spinach. ?Edamame, cooked. There are 130 mg of calcium in ? cup (47 g) of cooked edamame. ?Collard greens, cooked. There are 125 mg of calcium in ? cup (85 g) of cooked collard greens. ?Kale, frozen or cooked. There are 90 mg of calcium in ? cup (59 g) of cooked or frozen kale. ?Almonds. There are 95 mg of calcium in ? cup (35 g) of almonds. ?Broccoli, cooked. There are 60 mg of calcium in 1 cup (156 g) of cooked broccoli. ?The items listed above may not be a complete list of foods and beverages you can eat. Contact a dietitian for more options. ?What foods should I avoid? ?Lactose is found in dairy milk and dairy products, such as: ?Yogurt. ?Cheese. ?Butter. ?Margarine. ?Sour cream. ?Cream. ?Whipped toppings and creamers. ?Ice cream and other dairy-based desserts. ?Lactose is also found in foods or products made with dairy milk or milk ingredients. To find out whether a food contains dairy milk or a milk ingredient, look at the ingredients list. Avoid foods with the statement "May contain milk" and foods that contain: ?Milk powder. ?Whey. ?Curd. ?Lactose. ?  Lactoglobulin. ?The items listed above may not be a complete list of foods and beverages to avoid. Contact a dietitian for more information. ?Where to find more information ?[employer] Diabetes and Digestive [employer] Kidney Diseases: [email or web address] ?Summary ?If you are lactose intolerant, it means that you are not able to digest lactose, a natural sugar found in milk and milk products. ?Following a lactose-free  diet can help you manage this condition. ?Calcium is important for bone health and is found in many foods that contain lactose. Talk with your health care provider about other sources of calcium. ?This

## 2021-06-28 NOTE — Progress Notes (Signed)
? ? ? ?06/28/2021 ?Samantha [name] ?[id number] ?11-Nov-1980 ? ? ?HISTORY OF PRESENT ILLNESS: This is a 41 year old female who is a patient of Dr. Christella Hartigan.  She has followed here since 2018 for issues with rectal bleeding related to anal fissure.  She tells me that it has never completely healed or gone away.  She is asking for refill on her nitro gel.  She is also wanting to discuss her altered bowel habits.  She says that she alternates from constipation to loose stool/diarrhea.  Has abdominal pain and cramping.  She says that her initial TSH studies were always normal, but she would like to have her other thyroid levels checked.  She does have anxiety and is on medication for those issues. ? ?Flexible sigmoidoscopy with Dr. Rhea Belton in 2018 showed an anal fissure, but was otherwise unremarkable. ? ? ?Past Medical History:  ?Diagnosis Date  ? Anal fissure   ? Anxiety   ? Bilateral bunions   ? Bronchitis   ? Clotting disorder (HCC)   ? DVT/PE 2018  ? Insomnia   ? Leg fracture   ? ?Past Surgical History:  ?Procedure Laterality Date  ? LEG SURGERY    ? WISDOM TOOTH EXTRACTION    ? ? reports that she has never smoked. She has never used smokeless tobacco. She reports current alcohol use. She reports that she does not use drugs. ?family history includes Cancer in her paternal grandfather; Diabetes in her father; Thyroid disease in her mother. ?No Known Allergies ? ?  ?Outpatient Encounter Medications as of 06/28/2021  ?Medication Sig  ? clonazePAM (KLONOPIN) 2 MG tablet Take 2 mg by mouth as needed for anxiety.  ? cyclobenzaprine (FLEXERIL) 10 MG tablet 1 tablet at bedtime as needed  ? Homeopathic Products (ZICAM ALLERGY RELIEF NA) Place into the nose.  ? LEXAPRO 20 MG tablet Take 20 mg by mouth daily.   ? Melatonin 10 MG TABS Take 10 mg by mouth at bedtime.   ? Multiple Vitamins-Minerals (MULTIVITAMIN WITH MINERALS) tablet Take 1 tablet by mouth daily.  ? traZODone (DESYREL) 50 MG tablet Take 50 mg by mouth at bedtime.  ?  [DISCONTINUED] cyclobenzaprine (FLEXERIL) 10 MG tablet Take 10 mg by mouth 3 (three) times daily as needed for muscle spasms.  ? [DISCONTINUED] AMBULATORY NON FORMULARY MEDICATION Nitroglycerine ointment 0.125 %  ?Apply a pea sized amount internally four times daily. ?Dispense 30 GM zero refill  ? [DISCONTINUED] diphenhydrAMINE (BENADRYL) 25 MG tablet Take 25 mg by mouth at bedtime.  ? [DISCONTINUED] gentamicin cream (GARAMYCIN) 0.1 % Apply 1 application topically 2 (two) times daily.  ? [DISCONTINUED] ibuprofen (ADVIL) 800 MG tablet Take 800 mg by mouth 3 (three) times daily.  ? [DISCONTINUED] 0.9 %  sodium chloride infusion   ? ?No facility-administered encounter medications on file as of 06/28/2021.  ? ? ? ?REVIEW OF SYSTEMS  : All other systems reviewed and negative except where noted in the History of Present Illness. ? ? ?PHYSICAL EXAM: ?BP 114/78   Pulse 75   Ht 5\' 7"  (1.702 m)   Wt (!) 302 lb (137 kg)   SpO2 99%   BMI 47.30 kg/m?  ?General: Well developed white female in no acute distress ?Head: Normocephalic and atraumatic ?Eyes:  Sclerae anicteric, conjunctiva pink. ?Ears: Normal auditory acuity ?Lungs: Clear throughout to auscultation; no W/R/R. ?Heart: Regular rate and rhythm; no M/R/G. ?Abdomen: Soft, non-distended.  BS present.  Non-tender. ?Musculoskeletal: Symmetrical with no gross deformities  ?Skin: No lesions  on visible extremities ?Extremities: No edema  ?Neurological: Alert oriented x 4, grossly non-focal ?Psychological:  Alert and cooperative. Normal mood and affect ? ?ASSESSMENT AND PLAN: ?*Altered bowel habits:  Suspect possible IBS.  She has undergone a flex sig previously but never a full colonoscopy.  This is actually our first conversation about this issue.  Alternates between constipation and loose stool.  Has abdominal pain and cramping.  We will check celiac labs.  She tells me her TSH has been normal, but she would like her other thyroid levels checked.  We will check a free T3  and free T4.  We discussed lactose-free and FODMAP diets and she was given literature on these.  We discussed trying Benefiber 2 teaspoons mixed in 8 ounces of liquid daily for a couple weeks and increasing to twice daily if needed and desired.  She will then follow-up with me in about 6 weeks.  May need colonoscopy if no improvement.  We will try Levsin 0.125 mg as needed for abdominal cramping.  Prescription sent to pharmacy. 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  Says that it has never gone away/healed.  Is asking for a refill on her nitro gel.  Will send that to her pharmacy. ? ? ?CC:  Wilfrid Lund, PA ? ?  ?

## 2021-06-28 NOTE — Progress Notes (Signed)
I agree with the above note, plan 

## 2021-06-29 ENCOUNTER — Ambulatory Visit
Admission: RE | Admit: 2021-06-29 | Discharge: 2021-06-29 | Disposition: A | Discharge status: Home / Self Care | Payer: 59 | Source: Ambulatory Visit | Attending: Family Medicine | Admitting: Family Medicine

## 2021-06-29 DIAGNOSIS — M5442 Lumbago with sciatica, left side: Secondary | ICD-10-CM

## 2021-07-03 LAB — IGA: Immunoglobulin A: 144 mg/dL (ref 47–310)

## 2021-07-03 LAB — TISSUE TRANSGLUTAMINASE, IGA: (tTG) Ab, IgA: <1 U/mL

## 2021-07-23 ENCOUNTER — Ambulatory Visit: Payer: 59 | Admitting: Gastroenterology

## 2021-08-09 ENCOUNTER — Ambulatory Visit: Payer: 59 | Admitting: Gastroenterology

## 2021-08-13 ENCOUNTER — Ambulatory Visit: Payer: 59 | Admitting: Gastroenterology

## 2021-09-18 ENCOUNTER — Encounter: Payer: Self-pay | Admitting: Gastroenterology

## 2021-09-18 ENCOUNTER — Ambulatory Visit: Payer: 59 | Admitting: Gastroenterology

## 2021-09-18 VITALS — BP 124/72 | HR 87 | Ht 67.0 in | Wt 286.0 lb

## 2021-09-18 DIAGNOSIS — K602 Anal fissure, unspecified: Secondary | ICD-10-CM | POA: Diagnosis not present

## 2021-09-18 DIAGNOSIS — R194 Change in bowel habit: Secondary | ICD-10-CM

## 2021-09-18 MED ORDER — NA SULFATE-K SULFATE-MG SULF 17.5-3.13-1.6 GM/177ML PO SOLN: 1.0000 | Freq: Once | ORAL | 0 refills | Status: AC

## 2021-09-18 MED ORDER — AMBULATORY NON FORMULARY MEDICATION: 1 refills | Status: DC

## 2021-09-18 NOTE — Progress Notes (Signed)
09/18/2021 Samantha [name] [id number] [birth year]   HISTORY OF PRESENT ILLNESS: This is a 41 year old female who is a patient of Dr. Birdena Crandall.  She was seen by me last on 06/28/2021 and is here for her follow-up.  She has followed here since 2018 for issues related to rectal bleeding and anal fissure.  She had a flexible sigmoidoscopy with Dr. Rhea Belton in 2018 that showed a fissure but was otherwise unremarkable.  She has used nitro gel on and off with relief of her symptoms, but unsure that it has ever really completely healed or gone away.  At her last visit we discussed her altered bowel habits.  She alternates from constipation and loose stool/diarrhea with some abdominal cramping.  Thyroid studies were normal, celiac labs negative.  We discussed colonoscopy, but she wanted to defer at that time.  She is on Benefiber as we discussed.  Has not used the Levsin that I prescribed last time.  She saw a functional med doctor who performed a lot of blood and stool studies.  CRP was normal, fecal calprotectin and fecal elastase were normal.  She has completely cut gluten out of her diet for about the past month or so and has been completely lactose-free for the past couple of months.  She is being treated for overall yeast overgrowth currently with Diflucan.  She would like to go ahead and proceed with colonoscopy to be sure there is nothing else going on.  She said she also has been having some unexplained left leg numbness that nobody can figure out at this point.  Past Medical History:  Diagnosis Date   Anal fissure    Anxiety    Bilateral bunions    Bronchitis    Clotting disorder (HCC)    DVT/PE 2018   Insomnia    Leg fracture    Past Surgical History:  Procedure Laterality Date   LEG SURGERY     WISDOM TOOTH EXTRACTION      reports that she has never smoked. She has never used smokeless tobacco. She reports current alcohol use. She reports that she does not use drugs. family  history includes Cancer in her paternal grandfather; Diabetes in her father; Thyroid disease in her mother. No Known Allergies    Outpatient Encounter Medications as of 09/18/2021  Medication Sig   AMBULATORY NON FORMULARY MEDICATION Nitroglycerin 0.125% gel/Lidocaine 5%-- Apply a pea size amount to your rectum three times daily x 6-8 weeks.   clonazePAM (KLONOPIN) 2 MG tablet Take 2 mg by mouth as needed for anxiety.   cyclobenzaprine (FLEXERIL) 10 MG tablet 1 tablet at bedtime as needed   fluconazole (DIFLUCAN) 200 MG tablet Take 200 tablets by mouth daily.   LEXAPRO 20 MG tablet Take 20 mg by mouth daily.    Melatonin 10 MG TABS Take 10 mg by mouth at bedtime.    Multiple Vitamins-Minerals (MULTIVITAMIN WITH MINERALS) tablet Take 1 tablet by mouth daily.   NON FORMULARY Take 2 tablets by mouth 2 (two) times daily.   traZODone (DESYREL) 50 MG tablet Take 50 mg by mouth at bedtime.   Homeopathic Products (ZICAM ALLERGY RELIEF NA) Place into the nose. (Patient not taking: Reported on 09/18/2021)   Hyoscyamine Sulfate SL (LEVSIN/SL) 0.125 MG SUBL Place 1 tablet under the tongue every 6 (six) hours as needed. (Patient not taking: Reported on 09/18/2021)   No facility-administered encounter medications on file as of 09/18/2021.    REVIEW OF SYSTEMS  : All  other systems reviewed and negative except where noted in the History of Present Illness.   PHYSICAL EXAM: BP 124/72   Pulse 87   Ht 5\' 7"  (1.702 m)   Wt 286 lb (129.7 kg)   BMI 44.79 kg/m  General: Well developed white female in no acute distress Head: Normocephalic and atraumatic Eyes:  Sclerae anicteric, conjunctiva pink. Ears: Normal auditory acuity Lungs: Clear throughout to auscultation; no W/R/R. Heart: Regular rate and rhythm; no M/R/G. Abdomen: Soft, non-distended.  BS present.  Non-tender. Rectal:  Will be done at the time of colonoscopy. Musculoskeletal: Symmetrical with no gross deformities  Skin: No lesions on visible  extremities Extremities: No edema  Neurological: Alert oriented x 4, grossly non-focal Psychological:  Alert and cooperative. Normal mood and affect  ASSESSMENT AND PLAN: *Altered bowel habits: Suspected IBS.  Has undergone flex sig previously but has never had a full colonoscopy.  She alternates between constipation and loose stools with some abdominal cramping.  Celiac labs negative.  Thyroid studies have been normal.  She saw a functional medicine doctor and a CRP, fecal calprotectin, fecal elastase were all normal.  Is using Benefiber that we discussed previously, but has not needed to retry the Levsin.  She would like to go ahead and proceed with colonoscopy to be sure there are no other issues.  We will schedule that with Dr. in Dr. Rhea Belton absence.  The risks, benefits, and alternatives to colonoscopy were discussed with the patient and she consents to proceed.  *Anal fissure: Improved, but she has experienced issues on and off so would like a refill on her nitro gel with lidocaine.  We will send this to her pharmacy.   CC:  Christella Hartigan, Wilfrid Lund

## 2021-09-18 NOTE — Patient Instructions (Signed)
_______________________________________________________  If you are age 43 or older, your body mass index should be between 23-30. Your Body mass index is 44.79 kg/m. If this is out of the aforementioned range listed, please consider follow up with your Primary Care Provider.  If you are age 71 or younger, your body mass index should be between 19-25. Your Body mass index is 44.79 kg/m. If this is out of the aformentioned range listed, please consider follow up with your Primary Care Provider.   You have been scheduled for a colonoscopy. Please follow written instructions given to you at your visit today.  Please pick up your prep supplies at the pharmacy within the next 1-3 days. If you use inhalers (even only as needed), please bring them with you on the day of your procedure.  We have sent the following medications to your pharmacy for you to pick up at your convenience:Nitro gel.   The [place] [facility] providers would like to encourage you to use [facility] to communicate with providers for non-urgent requests or questions.  Due to long hold times on the telephone, sending your provider a message by [facility] may be a faster and more efficient way to get a response.  Please allow 48 business hours for a response.  Please remember that this is for non-urgent requests.   It was a pleasure to see you today!  Thank you for trusting me with your gastrointestinal care!    Doug Sou, PA-C

## 2021-09-23 ENCOUNTER — Encounter: Payer: Self-pay | Admitting: Internal Medicine

## 2021-09-24 NOTE — Progress Notes (Signed)
Addendum: Reviewed and agree with assessment and management plan. Nataliah Hatlestad M, MD  

## 2021-09-30 ENCOUNTER — Encounter: Payer: Self-pay | Admitting: Internal Medicine

## 2021-09-30 ENCOUNTER — Ambulatory Visit (AMBULATORY_SURGERY_CENTER): Payer: 59 | Admitting: Internal Medicine

## 2021-09-30 VITALS — BP 134/78 | HR 58 | Temp 96.6°F | Resp 6 | Ht 67.0 in | Wt 286.0 lb

## 2021-09-30 DIAGNOSIS — K6289 Other specified diseases of anus and rectum: Secondary | ICD-10-CM | POA: Diagnosis not present

## 2021-09-30 DIAGNOSIS — R194 Change in bowel habit: Secondary | ICD-10-CM

## 2021-09-30 MED ORDER — SODIUM CHLORIDE 0.9 % IV SOLN: 500.0000 mL | Freq: Once | INTRAVENOUS | Status: DC

## 2021-09-30 NOTE — Progress Notes (Signed)
Sedate, gd SR, tolerated procedure well, VSS, report to RN 

## 2021-09-30 NOTE — Progress Notes (Signed)
Pt's states no medical or surgical changes since previsit or office visit. 

## 2021-09-30 NOTE — Progress Notes (Signed)
See office note dated 09/18/2021 for details and current H&P  Patient presenting for colonoscopy for altered bowel habits though suspicion is IBS  She remains appropriate for colonoscopy in the [facility] today.

## 2021-09-30 NOTE — Progress Notes (Signed)
Called to room to assist during endoscopic procedure.  Patient ID and intended procedure confirmed with present staff. Received instructions for my participation in the procedure from the performing physician.  

## 2021-09-30 NOTE — Op Note (Signed)
[facility] Patient Name: Samantha [name] Procedure Date: 09/30/2021 8:36 AM MRN: [id number] Endoscopist: Beverley Fiedler , MD Age: 41 Referring MD:  Date of Birth: 02/14/1981 Gender: Female Account #: [id number] Procedure:                Colonoscopy Indications:              Change in bowel habits noted, constipation and                            diarrhea, incomplete improvement with gluten and                            lactose free diet Medicines:                Monitored Anesthesia Care Procedure:                Pre-Anesthesia Assessment:                           - Prior to the procedure, a History and Physical                            was performed, and patient medications and                            allergies were reviewed. The patient's tolerance of                            previous anesthesia was also reviewed. The risks                            and benefits of the procedure and the sedation                            options and risks were discussed with the patient.                            All questions were answered, and informed consent                            was obtained. Prior Anticoagulants: The patient has                            taken no previous anticoagulant or antiplatelet                            agents. ASA Grade Assessment: II - A patient with                            mild systemic disease. After reviewing the risks                            and benefits, the patient was deemed in  satisfactory condition to undergo the procedure.                           After obtaining informed consent, the colonoscope                            was passed under direct vision. Throughout the                            procedure, the patient's blood pressure, pulse, and                            oxygen saturations were monitored continuously. The                            Olympus PCF-H190DL [id number])  Colonoscope was                            introduced through the anus and advanced to the                            cecum, identified by appendiceal orifice and                            ileocecal valve. The colonoscopy was performed                            without difficulty. The patient tolerated the                            procedure well. The quality of the bowel                            preparation was excellent. The ileocecal valve,                            appendiceal orifice, and rectum were photographed. Scope In: 8:51:35 AM Scope Out: 9:04:22 AM Scope Withdrawal Time: 0 hours 10 minutes 11 seconds  Total Procedure Duration: 0 hours 12 minutes 47 seconds  Findings:                 The digital rectal exam was normal.                           The colon (entire examined portion) appeared                            normal. Biopsies for histology were taken with a                            cold forceps from the right colon and left colon                            for evaluation of microscopic colitis.  Anal papilla(e) were hypertrophied and retroflexed                            views otherwise normal. Complications:            No immediate complications. Estimated Blood Loss:     Estimated blood loss was minimal. Impression:               - The entire examined colon is normal. Biopsied.                           - Anal papillae were hypertrophied. Recommendation:           - Patient has a contact number available for                            emergencies. The signs and symptoms of potential                            delayed complications were discussed with the                            patient. Return to normal activities tomorrow.                            Written discharge instructions were provided to the                            patient.                           - Resume previous diet.                           - Continue present  medications.                           - If biopsies negative and persistent symptoms                            after fluconazole course would recommend trial of                            rifaximin x 14 day for IBS-D. Continue Benefiber                            therapy.                           - Await pathology results.                           - Repeat colonoscopy in 10 years for screening                            purposes. Beverley Fiedler, MD 09/30/2021 9:08:59 AM This report has been signed electronically.

## 2021-09-30 NOTE — Patient Instructions (Signed)
YOU HAD AN ENDOSCOPIC PROCEDURE TODAY AT THE [facility]:   Refer to the procedure report that was given to you for any specific questions about what was found during the examination.  If the procedure report does not answer your questions, please call your gastroenterologist to clarify.  If you requested that your care partner not be given the details of your procedure findings, then the procedure report has been included in a sealed envelope for you to review at your convenience later.  YOU SHOULD EXPECT: Some feelings of bloating in the abdomen. Passage of more gas than usual.  Walking can help get rid of the air that was put into your GI tract during the procedure and reduce the bloating. If you had a lower endoscopy (such as a colonoscopy or flexible sigmoidoscopy) you may notice spotting of blood in your stool or on the toilet paper. If you underwent a bowel prep for your procedure, you may not have a normal bowel movement for a few days.  Please Note:  You might notice some irritation and congestion in your nose or some drainage.  This is from the oxygen used during your procedure.  There is no need for concern and it should clear up in a day or so.  SYMPTOMS TO REPORT IMMEDIATELY:  Following lower endoscopy (colonoscopy or flexible sigmoidoscopy):  Excessive amounts of blood in the stool  Significant tenderness or worsening of abdominal pains  Swelling of the abdomen that is new, acute  Fever of 100F or higher  For urgent or emergent issues, a gastroenterologist can be reached at any hour by calling [phone]. Do not use MyChart messaging for urgent concerns.    DIET:  We do recommend a small meal at first, but then you may proceed to your regular diet.  Drink plenty of fluids but you should avoid alcoholic beverages for 24 hours.  ACTIVITY:  You should plan to take it easy for the rest of today and you should NOT DRIVE or use heavy machinery until tomorrow (because of  the sedation medicines used during the test).    FOLLOW UP: Our staff will call the number listed on your records the next business day following your procedure.  We will call around 7:15- 8:00 am to check on you and address any questions or concerns that you may have regarding the information given to you following your procedure. If we do not reach you, we will leave a message.  If you develop any symptoms (ie: fever, flu-like symptoms, shortness of breath, cough etc.) before then, please call [phone].  If you test positive for Covid 19 in the 2 weeks post procedure, please call and report this information to us.    If any biopsies were taken you will be contacted by phone or by letter within the next 1-3 weeks.  Please call us at [phone] if you have not heard about the biopsies in 3 weeks.    SIGNATURES/CONFIDENTIALITY: You and/or your care partner have signed paperwork which will be entered into your electronic medical record.  These signatures attest to the fact that that the information above on your After Visit Summary has been reviewed and is understood.  Full responsibility of the confidentiality of this discharge information lies with you and/or your care-partner.  

## 2021-10-01 ENCOUNTER — Telehealth: Payer: Self-pay | Admitting: *Deleted

## 2021-10-01 NOTE — Telephone Encounter (Signed)
  Follow up Call-     09/30/2021    7:40 AM  Call back number  Post procedure Call Back phone  # [phone]  Permission to leave phone message Yes     Patient questions:  Message left to call us if necessary.

## 2021-10-02 ENCOUNTER — Encounter: Payer: Self-pay | Admitting: Internal Medicine

## 2021-10-05 ENCOUNTER — Ambulatory Visit (HOSPITAL_COMMUNITY)
Admission: EM | Admit: 2021-10-05 | Discharge: 2021-10-05 | Disposition: A | Discharge status: Home / Self Care | Payer: 59 | Attending: Emergency Medicine | Admitting: Emergency Medicine

## 2021-10-05 ENCOUNTER — Encounter (HOSPITAL_COMMUNITY): Payer: Self-pay

## 2021-10-05 DIAGNOSIS — S0990XA Unspecified injury of head, initial encounter: Secondary | ICD-10-CM | POA: Diagnosis not present

## 2021-10-05 DIAGNOSIS — T148XXA Other injury of unspecified body region, initial encounter: Secondary | ICD-10-CM | POA: Diagnosis not present

## 2021-10-05 MED ORDER — CYCLOBENZAPRINE HCL 10 MG PO TABS: 10.0000 mg | ORAL_TABLET | Freq: Two times a day (BID) | ORAL | 0 refills | Status: AC | PRN

## 2021-10-05 MED ORDER — IBUPROFEN 400 MG PO TABS: 400.0000 mg | ORAL_TABLET | Freq: Three times a day (TID) | ORAL | 0 refills | Status: DC | PRN

## 2021-10-05 NOTE — ED Provider Notes (Signed)
[facility]    CSN: [id number] Arrival date & time: 10/05/21  1507    HISTORY   Chief Complaint  Patient presents with   Head Injury    Pt fell today and hit back of head and injured back. Pt has a lump in the back of the head and is in pain. Pt denies , nausea, or concussion   HPI Samantha [name] is a pleasant, 41 y.o. female who presents to urgent care today. Patient states that while attempting to climb into a hammock earlier today, she fell on the ground instead and hit the back of her head and her upper back.  Patient states she landed on the ground and had some gravel in it.  Patient states she thinks she has a small cut on the back of her head and also a bump in the same spot.  Patient states she did not lose consciousness, patient is here with her husband who witnessed the fall and states she did not either.  Patient states she has not had any changes in her vision, light sensitivity, dizziness, unstable gait, altered mental status, worsening of her life, nausea, vomiting.  Patient states she has a mild headache at this time mostly has pain at the base of her neck and in her shoulders.  The history is provided by the patient and the spouse.   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    Home Medications    Prior to Admission medications   Medication Sig Start Date End Date Taking? Authorizing Provider  AMBULATORY NON FORMULARY MEDICATION Nitroglycerin 0.125% gel/Lidocaine 5%-- Apply a pea size amount to your rectum three times daily x 6-8 weeks. 09/18/21   Zehr, Princella Pellegrini, PA-C  clonazePAM (KLONOPIN) 2 MG tablet Take 2 mg by mouth as needed for anxiety.    [provider]  cyclobenzaprine (FLEXERIL) 10 MG tablet 1 tablet at bedtime as needed 12/13/20   [provider]  fluconazole (DIFLUCAN) 200 MG tablet Take 200 tablets by mouth daily.    [provider]  Hyoscyamine Sulfate SL (LEVSIN/SL) 0.125 MG SUBL Place 1 tablet under the tongue every 6 (six) hours as needed. Patient not taking: Reported on 09/18/2021 06/28/21   Zehr, Shanda Bumps D, PA-C  ibuprofen (ADVIL) 200 MG tablet Take by mouth.    [provider]  LEXAPRO 20 MG tablet Take 20 mg by mouth daily.  08/08/12   [provider]  loratadine (ALAVERT) 10 MG dissolvable tablet 1 tablet on the tongue and allow to dissolve  [provider]  Melatonin 10 MG TABS Take 10 mg by mouth at bedtime.     [provider]  Multiple Vitamins-Minerals (MULTIVITAMIN WITH MINERALS) tablet Take 1 tablet by mouth daily.    [provider]  NON FORMULARY Take 2 tablets by mouth 2 (two) times daily.    [provider]  traZODone (DESYREL) 50 MG tablet Take 50 mg by mouth at bedtime.    [provider]    Family History Family History  Problem Relation Age of Onset   Thyroid disease Mother    Diabetes Father    Cancer Paternal Grandfather        skin   Colon cancer Neg Hx    Esophageal cancer Neg Hx    Stomach cancer Neg Hx    Rectal cancer Neg Hx    Social History Social History   Tobacco Use   Smoking  status: Never   Smokeless tobacco: Never  Vaping Use   Vaping Use: Never used  Substance Use Topics   Alcohol use: Yes   Drug use: No   Allergies   Patient has no known allergies.  Review of Systems Review of Systems Pertinent findings revealed after performing a 14 point review of systems has been noted in the history of present illness.  Physical Exam Triage Vital Signs ED Triage Vitals  Enc Vitals Group     BP 12/14/20 0827 (!) 147/82     Pulse Rate 12/14/20 0827 72     Resp 12/14/20 0827 18     Temp 12/14/20 0827 98.3 F (36.8 C)     Temp Source 12/14/20 0827 Oral     SpO2 12/14/20 0827 98 %     Weight --      Height --      Head Circumference --      Peak Flow --      Pain Score 12/14/20 0826 5     Pain Loc --      Pain Edu? --      Excl. in GC? --   No data found.  Updated Vital Signs BP 138/86 (BP Location: Right Arm)   Pulse 60   Temp 98 F (36.7 C) (Oral)   Resp 12   Ht 5\' 7"  (1.702 m)   Wt 285 lb (129.3 kg)   LMP 09/19/2021   SpO2 98%   BMI 44.64 kg/m   Physical Exam Vitals and nursing note reviewed.  Constitutional:      General: She is not in acute distress.    Appearance: Normal appearance.  HENT:     Head: Normocephalic and atraumatic.      Comments: Small laceration and bruise at the base of head with mild tenderness to palpation. Eyes:     Pupils: Pupils are equal, round, and reactive to light.  Cardiovascular:     Rate and Rhythm: Normal rate and regular rhythm.  Pulmonary:     Effort: Pulmonary effort is normal.     Breath sounds: Normal breath sounds.  Musculoskeletal:        General: Normal range of motion.     Right shoulder: Normal.     Left shoulder: Normal.     Cervical back: Normal range of motion and neck supple. Tenderness present. No signs of trauma, rigidity, spasms, torticollis or bony tenderness. Muscular tenderness present. No pain with movement or spinous process tenderness. Normal range of motion.  Skin:     General: Skin is warm and dry.  Neurological:     General: No focal deficit present.     Mental Status: She is alert and oriented to person, place, and time. Mental status is at baseline.     Cranial Nerves: Cranial nerves 2-12 are intact.     Sensory: Sensation is intact.     Motor: Motor function is intact.     Coordination: Coordination is intact.  Psychiatric:        Mood and Affect: Mood normal.        Behavior: Behavior normal.        Thought Content: Thought content normal.        Judgment: Judgment normal.     Visual Acuity Right Eye Distance:   Left Eye Distance:   Bilateral Distance:    Right Eye Near:   Left Eye Near:    Bilateral Near:     UC Couse / Diagnostics / Procedures:     Radiology No results found.  Procedures Procedures (including critical care time) EKG  Pending results:  Labs Reviewed - No data to display  Medications Ordered in UC: Medications - No data to display  UC Diagnoses / Final Clinical Impressions(s)   I have reviewed the triage vital signs and the nursing notes.  Pertinent labs & imaging results that were available during my care of the patient were reviewed by me and considered in my medical decision making (see chart for details).    Final diagnoses:  Bruise  Minor head injury, initial encounter   Patient provided with patient information about concussions and symptoms to watch for for the next 2 to 3 days.  At this time, recommend nonsteroidal anti-inflammatory pain medication along with muscle relaxer, ice to affected area.  Return precautions advised.  ED Prescriptions     Medication Sig Dispense Auth. Provider   ibuprofen (ADVIL) 400 MG tablet Take 1 tablet (400 mg total) by mouth every 8 (eight) hours as needed for up to 30 doses. 30 tablet Lynden Oxford Scales, PA-C   cyclobenzaprine (FLEXERIL) 10 MG tablet Take 1 tablet (10 mg total) by mouth 2 (two) times daily as needed for muscle spasms. 20 tablet Lynden Oxford  Scales, PA-C      PDMP not reviewed this encounter.  Pending results:  Labs Reviewed - No data to display  Discharge Instructions:   Discharge Instructions      Please begin Flexeril and ibuprofen as directed for relief of pain and muscle soreness.  Please monitor your symptoms for signs concerning for concussion, please read the enclosed handout about concussion for more information.  Thank you for visiting urgent care today.      Disposition Upon Discharge:  Condition: stable for discharge home  Patient presented with an acute illness with associated systemic symptoms and significant discomfort requiring urgent management. In my opinion, this is a condition that a prudent lay person (someone who possesses an average knowledge of health and medicine) may potentially expect to result in complications if not addressed urgently such as respiratory distress, impairment of bodily function or dysfunction of bodily organs.   Routine symptom specific, illness specific and/or disease specific instructions were discussed with the patient and/or caregiver at length.   As such, the patient has been evaluated and assessed, work-up was performed and treatment was provided in alignment with urgent care protocols and evidence based medicine.  Patient/parent/caregiver has been advised that the patient may require follow up for further testing and treatment if the symptoms continue in spite of treatment,  as clinically indicated and appropriate.  Patient/parent/caregiver has been advised to return to the [facility] or PCP if no better; to PCP or the Emergency Department if new signs and symptoms develop, or if the current signs or symptoms continue to change or worsen for further workup, evaluation and treatment as clinically indicated and appropriate  The patient will follow up with their current PCP if and as advised. If the patient does not currently have a PCP we will assist them in obtaining one.    The patient may need specialty follow up if the symptoms continue, in spite of conservative treatment and management, for further workup, evaluation, consultation and treatment as clinically indicated and appropriate.   Patient/parent/caregiver verbalized understanding and agreement of plan as discussed.  All questions were addressed during visit.  Please see discharge instructions below for further details of plan.  This office note has been dictated using Museum/gallery curator.  Unfortunately, this method of dictation can sometimes lead to typographical or grammatical errors.  I apologize for your inconvenience in advance if this occurs.  Please do not hesitate to reach out to me if clarification is needed.      Lynden Oxford Scales, PA-C 10/05/21 1719

## 2021-10-05 NOTE — ED Triage Notes (Signed)
Pt fell today and hit back of head and back. Pt has a lump on back of the head. Pt denies loss of consciousness

## 2021-10-05 NOTE — Discharge Instructions (Signed)
Please begin Flexeril and ibuprofen as directed for relief of pain and muscle soreness.  Please monitor your symptoms for signs concerning for concussion, please read the enclosed handout about concussion for more information.  Thank you for visiting urgent care today.

## 2021-10-24 ENCOUNTER — Telehealth: Payer: Self-pay | Admitting: Diagnostic Neuroimaging

## 2021-10-24 NOTE — Telephone Encounter (Signed)
Pt would like a call to confirm [facility] has previous records from her orthopedic doctors office.

## 2021-10-24 NOTE — Telephone Encounter (Signed)
Called patient and advised we have her records from Dr Shelle Iron at [facility], he was the referring provider. Patient verbalized understanding, appreciation.

## 2021-10-29 ENCOUNTER — Encounter: Payer: Self-pay | Admitting: *Deleted

## 2021-10-30 ENCOUNTER — Ambulatory Visit: Payer: 59 | Admitting: Diagnostic Neuroimaging

## 2021-10-30 VITALS — BP 130/76 | Ht 67.0 in | Wt 277.1 lb

## 2021-10-30 DIAGNOSIS — G5713 Meralgia paresthetica, bilateral lower limbs: Secondary | ICD-10-CM | POA: Diagnosis not present

## 2021-10-30 NOTE — Progress Notes (Signed)
[facility]  PATIENT: Samantha [name] DOB: June 15, 1980  REFERRING CLINICIAN: Jene Every, MD HISTORY FROM: patient  REASON FOR VISIT: new consult    HISTORICAL  CHIEF COMPLAINT:  Chief Complaint  Patient presents with   New Patient (Initial Visit)    Pt states she lost sensation in left leg. She states she does feel some tingling in left leg and right leg some numbness. Room 6 alone    HISTORY OF PRESENT ILLNESS:   41 year old female here for evaluation of numbness and tingling.  For past 4 years patient has had numbness in left anterolateral thigh.  Slightly affecting her right anterolateral thigh recently.  Patient has remote history of right ankle fracture and sprain in 2018.  Also has some chronic low back pain and hip pain.  She has been doing exercises and changing her diet recently, has lost 30 pounds and symptoms are improving.  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   REVIEW OF SYSTEMS: Full 14 system review of systems performed and negative with exception of: as per HPI.   ALLERGIES: No Known Allergies  HOME MEDICATIONS: Outpatient Medications Prior to Visit  Medication Sig Dispense Refill   AMBULATORY NON FORMULARY MEDICATION Nitroglycerin 0.125% gel/Lidocaine 5%-- Apply a pea size amount to your rectum three times daily x 6-8 weeks. 30 g 1   clonazePAM (KLONOPIN) 2 MG tablet Take 2 mg by mouth as needed for anxiety.     cyclobenzaprine (FLEXERIL) 10 MG tablet Take 1 tablet (10 mg total) by mouth 2 (two) times daily as needed for muscle spasms. 20 tablet 0   loratadine (ALAVERT) 10 MG dissolvable tablet 1 tablet on the tongue and allow to dissolve     Melatonin 10 MG TABS Take 10 mg by mouth at bedtime.      Multiple Vitamins-Minerals (MULTIVITAMIN WITH MINERALS) tablet Take 1 tablet by mouth daily.     NON FORMULARY Take 2 tablets by mouth 2  (two) times daily.     fluconazole (DIFLUCAN) 200 MG tablet Take 200 tablets by mouth daily. (Patient not taking: Reported on 10/30/2021)     ibuprofen (ADVIL) 400 MG tablet Take 1 tablet (400 mg total) by mouth every 8 (eight) hours as needed for up to 30 doses. (Patient not taking: Reported on 10/30/2021) 30 tablet 0   traZODone (DESYREL) 50 MG tablet Take 50 mg by mouth at bedtime. (Patient not taking: Reported on 10/30/2021)     No facility-administered medications prior to visit.    PAST MEDICAL HISTORY: Past Medical History:  Diagnosis Date   Anal fissure    Anxiety    Bilateral bunions    Bronchitis    Clotting disorder (HCC)    DVT/PE 2018   Insomnia    Leg fracture    Peripheral neuropathic pain     PAST SURGICAL HISTORY: Past Surgical History:  Procedure Laterality Date   LEG SURGERY     WISDOM TOOTH EXTRACTION      FAMILY HISTORY: Family History  Problem Relation Age of Onset   Thyroid disease Mother    Heart disease Mother    Heart disease Father    Diabetes Father    Arthritis Maternal Grandmother    Cancer Paternal Grandfather        skin   Colon cancer Neg Hx    Esophageal cancer Neg Hx    Stomach cancer Neg Hx    Rectal  cancer Neg Hx     SOCIAL HISTORY: Social History   Socioeconomic History   Marital status: Married    Spouse name: Not on file   Number of children: Not on file   Years of education: Not on file   Highest education level: Not on file  Occupational History   Not on file  Tobacco Use   Smoking status: Never   Smokeless tobacco: Never  Vaping Use   Vaping Use: Never used  Substance and Sexual Activity   Alcohol use: Yes    Comment: occas   Drug use: No   Sexual activity: Not on file  Other Topics Concern   Not on file  Social History Narrative   Not on file   Social Determinants of Health   Financial Resource Strain: Not on file  Food Insecurity: Not on file  Transportation Needs: Not on file  Physical Activity: Not  on file  Stress: Not on file  Social Connections: Not on file  Intimate Partner Violence: Not on file     PHYSICAL EXAM  GENERAL EXAM/CONSTITUTIONAL: Vitals:  Vitals:   10/30/21 0909  BP: 130/76  Weight: 277 lb 2 oz (125.7 kg)  Height: 5\' 7"  (1.702 m)   Body mass index is 43.4 kg/m. Wt Readings from Last 3 Encounters:  10/30/21 277 lb 2 oz (125.7 kg)  10/05/21 285 lb (129.3 kg)  09/30/21 286 lb (129.7 kg)   Patient is in no distress; well developed, nourished and groomed; neck is supple  CARDIOVASCULAR: Examination of carotid arteries is normal; no carotid bruits Regular rate and rhythm, no murmurs Examination of peripheral vascular system by observation and palpation is normal  EYES: Ophthalmoscopic exam of optic discs and posterior segments is normal; no papilledema or hemorrhages No results found.  MUSCULOSKELETAL: Gait, strength, tone, movements noted in Neurologic exam below  NEUROLOGIC: MENTAL STATUS:      No data to display         awake, alert, oriented to person, place and time recent and remote memory intact normal attention and concentration language fluent, comprehension intact, naming intact fund of knowledge appropriate  CRANIAL NERVE:  2nd - no papilledema on fundoscopic exam 2nd, 3rd, 4th, 6th - pupils equal and reactive to light, visual fields full to confrontation, extraocular muscles intact, no nystagmus 5th - facial sensation symmetric 7th - facial strength symmetric 8th - hearing intact 9th - palate elevates symmetrically, uvula midline 11th - shoulder shrug symmetric 12th - tongue protrusion midline  MOTOR:  normal bulk and tone, full strength in the BUE, BLE  SENSORY:  normal and symmetric to light touch, pinprick, temperature, vibration  COORDINATION:  finger-nose-finger, fine finger movements normal  REFLEXES:  deep tendon reflexes 1+ and symmetric  GAIT/STATION:  narrow based gait     DIAGNOSTIC DATA (LABS,  IMAGING, TESTING) - I reviewed patient records, labs, notes, testing and imaging myself where available.  Lab Results  Component Value Date   WBC 8.6 11/18/2016   HGB 12.5 11/18/2016   HCT 37.4 11/18/2016   MCV 89.5 11/18/2016   PLT 255 11/18/2016      Component Value Date/Time   NA 137 11/16/2016 0434   K 4.0 11/16/2016 0434   CL 108 11/16/2016 0434   CO2 18 (L) 11/16/2016 0434   GLUCOSE 130 (H) 11/16/2016 0434   BUN 13 11/16/2016 0434   CREATININE 0.77 11/16/2016 0434   CALCIUM 8.2 (L) 11/16/2016 0434   GFRNONAA >60 11/16/2016 0434   GFRAA >  60 11/16/2016 0434   No results found for: "CHOL", "HDL", "LDLCALC", "LDLDIRECT", "TRIG", "CHOLHDL" No results found for: "HGBA1C" No results found for: "VITAMINB12" No results found for: "TSH"      ASSESSMENT AND PLAN  41 y.o. year old female here with:   Dx:  1. Meralgia paresthetica of both lower extremities      PLAN:  MERALGIA PARESTHETICA (L > R) - signs and symptoms are classic for meralgia paresthetica, likely related to body habitus - continue to optimize nutrition, exercise, mobility and flexibility exercises  "NEUROPATHY" findings on outside EMG/NCS -Sural abnormality may be related to technical factors; common peroneal neuropathy may be related to prior ankle injury; no signs of systemic underlying neuropathy based on neurologic exam.  Symptoms are improving overall and likely related to above meralgia paresthetica; recommend to monitor for now and could repeat EMG nerve neck study in the future if signs of systemic neuropathy develop  Return for pending if symptoms worsen or fail to improve.    Suanne Marker, MD 10/30/2021, 9:50 AM Certified in Neurology, Neurophysiology and Neuroimaging  [facility] [address] [place], [place] [address] [phone]

## 2021-10-30 NOTE — Patient Instructions (Signed)
MERALGIA PARESTHETICA 

## 2021-10-31 ENCOUNTER — Encounter: Payer: Self-pay | Admitting: Diagnostic Neuroimaging

## 2021-11-21 ENCOUNTER — Ambulatory Visit: Admission: RE | Admit: 2021-11-21 | Payer: 59 | Source: Ambulatory Visit

## 2021-11-21 ENCOUNTER — Other Ambulatory Visit: Payer: Self-pay | Admitting: Obstetrics and Gynecology

## 2021-11-21 ENCOUNTER — Ambulatory Visit
Admission: RE | Admit: 2021-11-21 | Discharge: 2021-11-21 | Disposition: A | Discharge status: Home / Self Care | Payer: 59 | Source: Ambulatory Visit | Attending: Obstetrics and Gynecology | Admitting: Obstetrics and Gynecology

## 2021-11-21 DIAGNOSIS — N6320 Unspecified lump in the left breast, unspecified quadrant: Secondary | ICD-10-CM

## 2022-05-01 ENCOUNTER — Encounter: Payer: Self-pay | Admitting: Obstetrics and Gynecology

## 2022-05-07 ENCOUNTER — Ambulatory Visit
Admission: RE | Admit: 2022-05-07 | Discharge: 2022-05-07 | Disposition: A | Discharge status: Home / Self Care | Payer: 59 | Source: Ambulatory Visit | Attending: Obstetrics and Gynecology | Admitting: Obstetrics and Gynecology

## 2022-05-07 DIAGNOSIS — N6320 Unspecified lump in the left breast, unspecified quadrant: Secondary | ICD-10-CM

## 2022-07-04 ENCOUNTER — Emergency Department (HOSPITAL_COMMUNITY): Payer: 59

## 2022-07-04 ENCOUNTER — Emergency Department (HOSPITAL_COMMUNITY)
Admission: EM | Admit: 2022-07-04 | Discharge: 2022-07-04 | Disposition: A | Discharge status: Home / Self Care | Payer: 59 | Attending: Emergency Medicine | Admitting: Emergency Medicine

## 2022-07-04 ENCOUNTER — Encounter (HOSPITAL_COMMUNITY): Payer: Self-pay | Admitting: Emergency Medicine

## 2022-07-04 ENCOUNTER — Other Ambulatory Visit: Payer: Self-pay

## 2022-07-04 DIAGNOSIS — R079 Chest pain, unspecified: Secondary | ICD-10-CM | POA: Diagnosis not present

## 2022-07-04 DIAGNOSIS — M549 Dorsalgia, unspecified: Secondary | ICD-10-CM | POA: Diagnosis not present

## 2022-07-04 LAB — CBC WITH DIFFERENTIAL/PLATELET
Abs Immature Granulocytes: 0.06 10*3/uL (ref 0.00–0.07)
Basophils Absolute: 0.1 10*3/uL (ref 0.0–0.1)
Basophils Relative: 1 %
Eosinophils Absolute: 0.1 10*3/uL (ref 0.0–0.5)
Eosinophils Relative: 1 %
HCT: 37.7 % (ref 36.0–46.0)
Hemoglobin: 12.3 g/dL (ref 12.0–15.0)
Immature Granulocytes: 1 %
Lymphocytes Relative: 29 %
Lymphs Abs: 3.4 10*3/uL (ref 0.7–4.0)
MCH: 28 pg (ref 26.0–34.0)
MCHC: 32.6 g/dL (ref 30.0–36.0)
MCV: 85.7 fL (ref 80.0–100.0)
Monocytes Absolute: 0.6 10*3/uL (ref 0.1–1.0)
Monocytes Relative: 5 %
Neutro Abs: 7.6 10*3/uL (ref 1.7–7.7)
Neutrophils Relative %: 63 %
Platelets: 352 10*3/uL (ref 150–400)
RBC: 4.4 MIL/uL (ref 3.87–5.11)
RDW: 13.1 % (ref 11.5–15.5)
WBC: 11.8 10*3/uL — ABNORMAL HIGH (ref 4.0–10.5)
nRBC: 0 % (ref 0.0–0.2)

## 2022-07-04 LAB — BASIC METABOLIC PANEL
Anion gap: 11 (ref 5–15)
BUN: 8 mg/dL (ref 6–20)
CO2: 22 mmol/L (ref 22–32)
Calcium: 9.1 mg/dL (ref 8.9–10.3)
Chloride: 102 mmol/L (ref 98–111)
Creatinine, Ser: 0.9 mg/dL (ref 0.44–1.00)
GFR, Estimated: >60 mL/min (ref >60)
Glucose, Bld: 157 mg/dL — ABNORMAL HIGH (ref 70–99)
Potassium: 3.5 mmol/L (ref 3.5–5.1)
Sodium: 135 mmol/L (ref 135–145)

## 2022-07-04 LAB — I-STAT BETA HCG BLOOD, ED (MC, WL, AP ONLY): I-stat hCG, quantitative: <5 m[IU]/mL (ref <5)

## 2022-07-04 LAB — TROPONIN I (HIGH SENSITIVITY)
Troponin I (High Sensitivity): 3 ng/L (ref <18)
Troponin I (High Sensitivity): <2 ng/L (ref <18)

## 2022-07-04 MED ORDER — KETOROLAC TROMETHAMINE 15 MG/ML IJ SOLN
15.0000 mg | Freq: Once | INTRAMUSCULAR | Status: AC
  Administered 2022-07-04: 15 mg via INTRAVENOUS
  Filled 2022-07-04: qty 1

## 2022-07-04 MED ORDER — ACETAMINOPHEN 500 MG PO TABS
1000.0000 mg | ORAL_TABLET | Freq: Once | ORAL | Status: AC
  Administered 2022-07-04: 1000 mg via ORAL
  Filled 2022-07-04: qty 2

## 2022-07-04 MED ORDER — IOHEXOL 350 MG/ML SOLN
75.0000 mL | Freq: Once | INTRAVENOUS | Status: AC | PRN
  Administered 2022-07-04: 75 mL via INTRAVENOUS

## 2022-07-04 MED ORDER — CELECOXIB 200 MG PO CAPS
200.0000 mg | ORAL_CAPSULE | Freq: Two times a day (BID) | ORAL | 0 refills | Status: DC
Start: 2022-07-04 — End: 2023-12-29

## 2022-07-04 NOTE — ED Triage Notes (Signed)
Pt c/o chest pain with taking in a deep breath that started 3 days ago. Pain radiates to the back. Similar s/s in the past with dx PE. Does not take blood thinners. Followed up with PCP with abnormal Chest XR. Recent travel, road trip to [place] 1 week ago.

## 2022-07-04 NOTE — ED Provider Notes (Signed)
[place] EMERGENCY DEPARTMENT AT [facility] Provider Note   CSN: [id number] Arrival date & time: 07/04/22  2007     History Chief Complaint  Patient presents with   Chest Pain    HPI Samantha [name] is a 42 y.o. female presenting for chest pain and back pain.  She states that she has had approximately 2 days of mid scapular back pain worse with inspiration. . Denies fevers chills nausea vomiting syncope or shortness of breath.  Otherwise ambulatory tolerating p.o. intake. History of pulmonary embolism in the past attributed to provocation from ankle fracture. She did go on a road trip approximately 5 weeks ago and recently was started on progesterone only containing birth control.  Patient's recorded medical, surgical, social, medication list and allergies were reviewed in the Snapshot window as part of the initial history.   Review of Systems   Review of Systems  Constitutional:  Negative for chills and fever.  HENT:  Negative for ear pain and sore throat.   Eyes:  Negative for pain and visual disturbance.  Respiratory:  Positive for chest tightness. Negative for cough and shortness of breath.   Cardiovascular:  Negative for chest pain and palpitations.  Gastrointestinal:  Negative for abdominal pain and vomiting.  Genitourinary:  Negative for dysuria and hematuria.  Musculoskeletal:  Negative for arthralgias and back pain.  Skin:  Negative for color change and rash.  Neurological:  Negative for seizures and syncope.  All other systems reviewed and are negative.   Physical Exam Updated Vital Signs BP (!) 154/89   Pulse 68   Temp 98.8 F (37.1 C) (Oral)   Resp 14   Ht 5\' 7"  (1.702 m)   Wt 125.6 kg   SpO2 100%   BMI 43.38 kg/m  Physical Exam Vitals and nursing note reviewed.  Constitutional:      General: She is not in acute distress.    Appearance: She is well-developed.  HENT:     Head: Normocephalic and atraumatic.  Eyes:      Conjunctiva/sclera: Conjunctivae normal.  Cardiovascular:     Rate and Rhythm: Normal rate and regular rhythm.     Heart sounds: No murmur heard. Pulmonary:     Effort: Pulmonary effort is normal. No respiratory distress.     Breath sounds: Normal breath sounds.  Abdominal:     General: There is no distension.     Palpations: Abdomen is soft.     Tenderness: There is no abdominal tenderness. There is no right CVA tenderness or left CVA tenderness.  Musculoskeletal:        General: No swelling or tenderness. Normal range of motion.     Cervical back: Neck supple.  Skin:    General: Skin is warm and dry.  Neurological:     General: No focal deficit present.     Mental Status: She is alert and oriented to person, place, and time. Mental status is at baseline.     Cranial Nerves: No cranial nerve deficit.      ED Course/ Medical Decision Making/ A&P    Procedures Procedures   Medications Ordered in ED Medications  iohexol (OMNIPAQUE) 350 MG/ML injection 75 mL (75 mLs Intravenous Contrast Given 07/04/22 2221)  ketorolac (TORADOL) 15 MG/ML injection 15 mg (15 mg Intravenous Given 07/04/22 2232)  acetaminophen (TYLENOL) tablet 1,000 mg (1,000 mg Oral Given 07/04/22 2232)   Medical Decision Making: [name] is a 42 y.o. female who presented to  the ED today with chest pain, detailed above.  Based on patient's comorbidities, patient has a heart score of 2.    Additional history discussed with patient's family/caregivers.  Patient placed on continuous vitals and telemetry monitoring while in ED which was reviewed periodically.  Complete initial physical exam performed, notably the patient was HDS in NAD.   Reviewed and confirmed nursing documentation for past medical history, family history, social history.    Initial Assessment: With the patient's presentation of left-sided chest pain, most likely diagnosis is musculoskeletal chest pain versus GERD, although ACS remains  on the differential. Other diagnoses were considered including (but not limited to) pulmonary embolism, community-acquired pneumonia, aortic dissection, pneumothorax, underlying bony abnormality, anemia. These are considered less likely due to history of present illness and physical exam findings.    Aortic Dissection also reconsidered but seems less likely based on the location, quality, onset, and severity of symptoms in this case. Patient has a lack of serious comorbidities for this condition including a lack of HTN or Smoking. Patient also has a lack of underlying history of AD or TAA.  This is most consistent with an acute life/limb threatening illness complicated by underlying chronic conditions.   Initial Plan: Evaluate for ACS with single troponin and EKG evaluated as below  Evaluate for dissection, bony abnormality, or pneumonia with chest x-ray and screening laboratory evaluation including CBC, BMP  Further evaluation for pulmonary embolism indicated at this time based on patient's PERC and Wells score.  Further evaluation for Thoracic Aortic Dissection not indicated at this time based on patient's clinical history and PE findings.   Initial Study Results: EKG was reviewed independently. Rate, rhythm, axis, intervals all examined and without medically relevant abnormality. ST segments without concerns for elevations.    Laboratory  Single troponin demonstrated NAA     CBC and BMP without obvious metabolic or inflammatory abnormalities requiring further evaluation   Radiology  CT Angio Chest PE W and/or Wo Contrast  Result Date: 07/04/2022 CLINICAL DATA:  Concern for pulmonary embolism. EXAM: CT ANGIOGRAPHY CHEST WITH CONTRAST TECHNIQUE: Multidetector CT imaging of the chest was performed using the standard protocol during bolus administration of intravenous contrast. Multiplanar CT image reconstructions and MIPs were obtained to evaluate the vascular anatomy. RADIATION DOSE REDUCTION:  This exam was performed according to the departmental dose-optimization program which includes automated exposure control, adjustment of the mA and/or kV according to patient size and/or use of iterative reconstruction technique. CONTRAST:  75mL OMNIPAQUE IOHEXOL 350 MG/ML SOLN COMPARISON:  Chest CT dated 11/16/2016 and radiograph dated 07/04/2022. FINDINGS: Cardiovascular: There is no cardiomegaly or pericardial effusion. The thoracic aorta is unremarkable. No pulmonary artery embolus identified. Mediastinum/Nodes: No hilar or mediastinal adenopathy. The esophagus is grossly unremarkable. No mediastinal fluid collection. Lungs/Pleura: There is a 4 mm right middle lobe nodule (69/6). No focal consolidation, pleural effusion, or pneumothorax. The central airways are patent. Upper Abdomen: No acute abnormality. Musculoskeletal: No chest wall abnormality. No acute or significant osseous findings. Review of the MIP images confirms the above findings. IMPRESSION: 1. No acute intrathoracic pathology. No CT evidence of pulmonary artery embolus. 2. A 4 mm right middle lobe nodule. No follow-up needed if patient is low-risk.This recommendation follows the consensus statement: Guidelines for Management of Incidental Pulmonary Nodules Detected on CT Images: From the Fleischner Society 2017; Radiology 2017; 284:228-243. Electronically Signed   By: Elgie Collard M.D.   On: 07/04/2022 22:32   DG Chest 2 View  Result Date: 07/04/2022 CLINICAL  DATA:  Chest pain EXAM: CHEST - 2 VIEW COMPARISON:  CT chest done on 11/16/2016 FINDINGS: The heart size and mediastinal contours are within normal limits. Both lungs are clear. The visualized skeletal structures are unremarkable. IMPRESSION: No active cardiopulmonary disease. Electronically Signed   By: Ernie Avena M.D.   On: 07/04/2022 21:07    Final Assessment and Plan: Patient's history of present onset physical exam findings are most consistent with skeletal  etiology.  Disposition:  I have considered need for hospitalization, however, considering all of the above, I believe this patient is stable for discharge at this time.  Patient/family educated about specific return precautions for given chief complaint and symptoms.  Patient/family educated about follow-up with PCP.     Patient/family expressed understanding of return precautions and need for follow-up. Patient spoken to regarding all imaging and laboratory results and appropriate follow up for these results. All education provided in verbal form with additional information in written form. Time was allowed for answering of patient questions. Patient discharged.    Emergency Department Medication Summary:   Medications  iohexol (OMNIPAQUE) 350 MG/ML injection 75 mL (75 mLs Intravenous Contrast Given 07/04/22 2221)  ketorolac (TORADOL) 15 MG/ML injection 15 mg (15 mg Intravenous Given 07/04/22 2232)  acetaminophen (TYLENOL) tablet 1,000 mg (1,000 mg Oral Given 07/04/22 2232)      Clinical Impression:  1. Chest pain, unspecified type      Discharge   Final Clinical Impression(s) / ED Diagnoses Final diagnoses:  Chest pain, unspecified type    Rx / DC Orders ED Discharge Orders          Ordered    celecoxib (CELEBREX) 200 MG capsule  2 times daily        07/04/22 2254              Glyn Ade, MD 07/04/22 2310

## 2022-09-25 ENCOUNTER — Other Ambulatory Visit (HOSPITAL_COMMUNITY): Payer: Self-pay

## 2022-09-25 MED ORDER — ZEPBOUND 15 MG/0.5ML ~~LOC~~ SOAJ
15.0000 mg | SUBCUTANEOUS | 0 refills | Status: DC
  Filled 2022-09-25: qty 2, 28d supply, fill #0

## 2023-04-20 ENCOUNTER — Other Ambulatory Visit: Payer: Self-pay | Admitting: Obstetrics and Gynecology

## 2023-04-20 DIAGNOSIS — Z1231 Encounter for screening mammogram for malignant neoplasm of breast: Secondary | ICD-10-CM

## 2023-05-08 ENCOUNTER — Ambulatory Visit
Admission: RE | Admit: 2023-05-08 | Discharge: 2023-05-08 | Disposition: A | Discharge status: Home / Self Care | Source: Ambulatory Visit | Attending: Obstetrics and Gynecology | Admitting: Obstetrics and Gynecology

## 2023-05-08 DIAGNOSIS — Z1231 Encounter for screening mammogram for malignant neoplasm of breast: Secondary | ICD-10-CM

## 2023-11-03 NOTE — Progress Notes (Signed)
 Subjective   Patient ID:  Samantha [name] is a 43 y.o. (DOB [birth year]) female.     Patient presents with  . office visit    Sono for irregular bleeding      New pt, here to est care.   States ~4 yrs of hot flashes, night sweats, anxiety, mood swings, PMS/PMDD swings, brain fog, and irregular periods.  Prior GYN placed her onto POPs for regulation.  This did help, regulated monthly.     PMH PE/DVT.  (This while had broken ankle, s/p surgical repair and using combo OCPs) Must AVOID estrogen.  Long discussion re this and pt v/u.  Rationale details discussed.    PMH anxiety, depression.  Taking Lexapro , Klonopin, trazodone .  Sees psych.   Virtual menopause clinic put pt onto Prometrium 200mg  nightly cyclically.  Took her off Micronor which was being used for cycle control.  Now using Prometrium 200mg .    Here for pelvic sono as per recs of Dr Sheria at virtual MP clinic.      Current Outpatient Medications on File Prior to Visit  Medication Sig Dispense Refill  . clindamycin (CLEOCIN T) 1 % lotion SMARTSIG:liberally Topical Daily    . clonazePAM (KLONOPIN) 0.5 mg tablet Take one tablet (0.5 mg dose) by mouth daily.    . escitalopram  oxalate (LEXAPRO ) 20 mg tablet  (Patient taking differently: one and a half tablets (30 mg dose).)    . loratadine (ALAVERT) 10 MG dissolvable tablet 1 tablet on the tongue and allow to dissolve    . Melatonin 5 MG CHEW     . Multiple Vitamin (ONE DAILY MULTIVITAMIN ADULT PO)     . progesterone (PROMETRIUM) 100 mg capsule Take two capsules (200 mg dose) by mouth at bedtime for 14 days. Take 1 capsule the first night and then take 2 capsules nightly moving forward 27 capsule 0  . Specialty Vitamins Products [facility]) CAPS     . traZODone  (DESYREL ) 50 mg tablet Take one tablet (50 mg dose) by mouth at bedtime as needed.     No current facility-administered medications on file prior to visit.     Past Medical History:  Diagnosis Date  .  DVT (deep venous thrombosis) (*)   . Food intolerance   . GAD (generalized anxiety disorder)   . Insomnia   . Pulmonary embolus (*)   . Seasonal allergies      Past Surgical History:  Procedure Laterality Date  . Tibia fracture surgery         Past Medical History, Past Surgery History, Allergies, Social History, and Family History were reviewed and updated.    Review of Systems is complete and negative except as noted.  Objective   BP 132/80 (BP Location: Left Upper Arm)   Ht 5' 7 (1.702 m)   Wt 269 lb 9.6 oz (122.3 kg)   LMP 10/15/2023 (Exact Date)   BMI 42.23 kg/m  General:  Well Developed, Well Nourished, No distress     Sono: Uterus: 7.57 x 4.95 x 4.01 cm. anteverted Endometrial stripe: 9.97 mm Fibroids: none seen Endometrial mass: none Cervix: normal Right ovary: normal, no masses, follicular cyst noted  Left ovary: normal, no masses Other findings of note: no FF  Rev'd all with pt.  Discussed must do FHS/E2 level now, to r/o menopausal vs perimenopausal.  I think likely more perimenopausal.  If found top be menopausal, will need to investigate her ES thickness in better detail with EMB.  Not ready to  state we need that just yet, will await FSH/E2 levels.          Impression   1. Amenorrhea   2. Hot flashes   3. Night sweats   4. Poor sleep     Plan   Will do FSH/E2 levels now.   Remain on P4 200mg  nightly for now.  Can take nightly ongoing if found to be truly menopausal.   Much discussion re: peri and tru post menopausal health, well being, bone health, calcium intake, regular wt bearing and CV health, routine GYN yrly exams.   Discussed her inc r/o blood clots with use of estrogen.  All questions answered.    States had negative genetic testing when had PE yrs ago, I suggest she pull those records and call heme office to see if she really needs the referred appt at end of the month.  Pt v/u, agrees.     Will remain on her psych  meds at present, and reassuring that her inc dose to 30mg  lexapro  helping.        Patient's Medications       * Accurate as of November 03, 2023  4:15 PM. Reflects encounter med changes as of last refresh          Continued Medications      Instructions  clindamycin 1 % lotion Commonly known as: CLEOCIN T  SMARTSIG:liberally Topical Daily   clonazePAM 0.5 mg tablet Commonly known as: KLONOPIN  0.5 mg, Daily   FEMQUIL Caps    loratadine 10 MG dissolvable tablet Commonly known as: ALAVERT  1 tablet on the tongue and allow to dissolve   Melatonin 5 MG Chew    ONE DAILY MULTIVITAMIN ADULT PO    progesterone 100 mg capsule Commonly known as: PROMETRIUM  200 mg, Oral, At bedtime, Take 1 capsule the first night and then take 2 capsules nightly moving forward   traZODone  50 mg tablet Commonly known as: DESYREL   50 mg, At bedtime as needed       Modified Medications      Instructions  escitalopram  oxalate 20 mg tablet Commonly known as: LEXAPRO  What changed: See the new instructions.        Discontinued Medications    celecoxib  200 mg capsule Commonly known as: CELEBREX  Stopped by: Delon Loyal   gabapentin 300 mg capsule Commonly known as: NEURONTIN Stopped by: Delon Loyal   ibuprofen  200 mg tablet Commonly known as: ADVIL ,MOTRIN  Stopped by: Delon Loyal   multivitamin with minerals tablet Stopped by: Delon Loyal   norethindrone 0.35 MG tablet Commonly known as: ORTHO MICRONOR,CAMILA,NORA-BE,ERRIN,JOLIVETTE,HEATHER,SHAROBEL Stopped by: Delon Loyal        Orders Placed This Encounter  Procedures  . FSH  . Estradiol    Risks, benefits, and alternatives of the medications and treatment plan prescribed today were discussed, and patient expressed understanding. Plan follow-up as discussed or as needed if any worsening symptoms or change in condition.    A yearly preventative health exam was recommended and current age  based recommendations were discussed.     *Some images could not be shown.

## 2023-12-29 ENCOUNTER — Inpatient Hospital Stay: Attending: Hematology and Oncology | Admitting: Hematology and Oncology

## 2023-12-29 ENCOUNTER — Encounter: Payer: Self-pay | Admitting: Hematology and Oncology

## 2023-12-29 ENCOUNTER — Inpatient Hospital Stay

## 2023-12-29 VITALS — BP 152/79 | HR 99 | Temp 99.8°F | Resp 18 | Ht 67.0 in | Wt 275.2 lb

## 2023-12-29 DIAGNOSIS — Z86711 Personal history of pulmonary embolism: Secondary | ICD-10-CM | POA: Insufficient documentation

## 2023-12-29 DIAGNOSIS — Z86718 Personal history of other venous thrombosis and embolism: Secondary | ICD-10-CM | POA: Insufficient documentation

## 2023-12-29 MED ORDER — TRAZODONE HCL 50 MG PO TABS: 50.0000 mg | ORAL_TABLET | Freq: Every day | ORAL | Status: DC

## 2023-12-29 NOTE — Progress Notes (Signed)
 [facility] CONSULT NOTE  Patient Care Team: Alben Therisa MATSU, PA as PCP - General (Family Medicine) Wonda Cy BROCKS, RD as Dietitian [facility])   ASSESSMENT & PLAN:  Personal history of venous thrombosis and embolism The patient developed right lower extremity DVT and PE in the setting of a right ankle injury and concurrent use of birth control pill at the same time.  Her birth control pill was discontinued and she was anticoagulated appropriately This was a provoked incidence She had been on birth control pills for almost 20 years at that point without thrombotic event until 2018 She had extensive thrombophilia workup at the time; test result showed no evidence of lupus anticoagulant, Antithrombin III  deficiency, prothrombin gene mutation, factor V Leiden mutation or protein C deficiency.  Protein S activity was borderline low likely due to diagnosis of blood clot at the time and cannot be interpreted accurately  Overall, I do not see contraindication for her to proceed with hormone replacement therapy with estrogen containing regimen.  However, I do recommend the patient to consider topical estrogen products whether in the form of patches or topical cream if possible.  Topical form of estrogen typically bypassed first-pass metabolism and the doses typically much lower in comparison with oral estrogen replacement products  We also discussed consideration for either hysterectomy or IUD placement as preferred risk profile for her rather than Prometrium.  The risk of DVT with IUD is generally much lower than oral progesterone only pills  We discussed general risk factors for recurrent DVT and PE such as surgery, prolonged immobilization, dehydration, certain hormone replacement products, smoking, cancer and others  There is no need for blood work today.  She does not need long-term follow-up.  Her gynecologist will be prescribing her hormone replacement therapy.  I will see her  in the future for perioperative anticoagulation management if needed.  All questions were answered. The patient knows to call the clinic with any problems, questions or concerns. The total time spent in the appointment was 55 minutes encounter with patients including review of chart and various tests results, discussions about plan of care and coordination of care plan  Samantha Bedford, MD 11/11/20251:57 PM  CHIEF COMPLAINTS/PURPOSE OF CONSULTATION:  History of right lower extremity DVT and PE, in the setting of right ankle injury  HISTORY OF PRESENTING ILLNESS:  Samantha [name] 43 y.o. female is here because of consideration for estrogen replacement therapy in the setting of perimenopausal symptoms, with background history of right lower extremity DVT and PE  I reviewed her records and collaborated the history with the patient On October 30, 2016, the patient fell and injured her right foot.  Her right ankle was immobilized by the boot.  At the time, she was on birth control pill On November 16, 2016, she had CT imaging of her chest which showed extensive bilateral pulmonary emboli.  Echocardiogram showed elevated right-sided pulmonary pressure On November 18, 2016, venous Doppler ultrasound showed acute deep vein thrombosis involving the right peroneal vein.  Her birth control pill was discontinued and she was anticoagulated.  Thrombophilia workup was performed on November 16, 2016, negative for lupus anticoagulant, protein C deficiency, homocystinemia, factor V Leiden mutation, prothrombin gene mutation, or Antithrombin III  deficiency.  Protein S activity is borderline low but not unexpected in the setting of acute DVT and PE  The patient recalls being on Xarelto  for approximately 6 months.  Subsequently, she developed perimenopausal symptoms and irregular menstruation.  She  was subsequently placed on progesterone only pill for symptomatic control.  She was also put on multiple other  medications for symptomatic control for hot flashes.  Recent hormone levels were drawn which showed low anti-mllerian hormone level  The patient desire hormone replacement therapy with estrogen containing regimen but due to history of PE and DVT, is referred here to discuss risk and benefits of being on estrogen containing regimen  She has no prior history of DVT or PE in the past.  She has never been pregnant.  She had leg surgery as a child and no other major surgeries except dental work. Currently, the patient is quite sedentary.  She will almost 12 hours every day and was not able to find time to exercise.  She drinks lots of water.  She commute to [place] on a regular basis.  She does not smoke. Her screening program is up-to-date.  She has no prior history of cancer There is no family history of blood clots or miscarriages.  MEDICAL HISTORY:  Past Medical History:  Diagnosis Date   Anal fissure    Anxiety    Bilateral bunions    Bronchitis    Clotting disorder    DVT/PE 2018   Insomnia    Leg fracture    Peripheral neuropathic pain     SURGICAL HISTORY: Past Surgical History:  Procedure Laterality Date   LEG SURGERY     WISDOM TOOTH EXTRACTION      SOCIAL HISTORY: Social History   Socioeconomic History   Marital status: Married    Spouse name: Not on file   Number of children: Not on file   Years of education: Not on file   Highest education level: Not on file  Occupational History   Not on file  Tobacco Use   Smoking status: Never   Smokeless tobacco: Never  Vaping Use   Vaping status: Never Used  Substance and Sexual Activity   Alcohol use: Yes    Comment: occas   Drug use: No   Sexual activity: Not on file  Other Topics Concern   Not on file  Social History Narrative   Not on file   Social Drivers of Health   Financial Resource Strain: Low Risk  (11/02/2023)   Received from [facility]   Overall Financial Resource Strain (CARDIA)    How  hard is it for you to pay for the very basics like food, housing, medical care, and heating?: Not hard at all  Food Insecurity: No Food Insecurity (11/02/2023)   Received from [facility]   Hunger Vital Sign    Within the past 12 months, you worried that your food would run out before you got the money to buy more.: Never true    Within the past 12 months, the food you bought just didn't last and you didn't have money to get more.: Never true  Transportation Needs: No Transportation Needs (11/02/2023)   Received from [facility] - Transportation    In the past 12 months, has lack of transportation kept you from medical appointments or from getting medications?: No    In the past 12 months, has lack of transportation kept you from meetings, work, or from getting things needed for daily living?: No  Physical Activity: Inactive (11/02/2023)   Received from [facility]   Exercise Vital Sign    On average, how many days per week do you engage in moderate to strenuous exercise (like a brisk walk)?:  0 days    Minutes of Exercise per Session: Not on file  Stress: Stress Concern Present (11/02/2023)   Received from [facility] - Occupational Stress Questionnaire    Do you feel stress - tense, restless, nervous, or anxious, or unable to sleep at night because your mind is troubled all the time - these days?: Very much  Social Connections: Somewhat Isolated (11/02/2023)   Received from [facility]   Social Network    How would you rate your social network (family, work, friends)?: Restricted participation with some degree of social isolation  Intimate Partner Violence: Not At Risk (11/02/2023)   Received from [facility]   HITS    Over the last 12 months how often did your partner physically hurt you?: Never    Over the last 12 months how often did your partner insult you or talk down to you?: Never    Over the last 12 months how often did  your partner threaten you with physical harm?: Never    Over the last 12 months how often did your partner scream or curse at you?: Never    FAMILY HISTORY: Family History  Problem Relation Age of Onset   Thyroid disease Mother    Heart disease Mother    Heart disease Father    Diabetes Father    Arthritis Maternal Grandmother    Cancer Paternal Grandfather        skin   Colon cancer Neg Hx    Esophageal cancer Neg Hx    Stomach cancer Neg Hx    Rectal cancer Neg Hx     ALLERGIES:  has no known allergies.  MEDICATIONS:  Current Outpatient Medications  Medication Sig Dispense Refill   traZODone  (DESYREL ) 50 MG tablet Take 50 mg by mouth at bedtime.     clonazePAM (KLONOPIN) 2 MG tablet Take 2 mg by mouth as needed for anxiety.     cyclobenzaprine  (FLEXERIL ) 10 MG tablet Take 1 tablet (10 mg total) by mouth 2 (two) times daily as needed for muscle spasms. 20 tablet 0   escitalopram  (LEXAPRO ) 20 MG tablet Take 30 mg by mouth daily.     loratadine (ALAVERT) 10 MG dissolvable tablet 1 tablet on the tongue and allow to dissolve     Melatonin 10 MG TABS Take 5 mg by mouth at bedtime.     Multiple Vitamins-Minerals (MULTIVITAMIN WITH MINERALS) tablet Take 1 tablet by mouth daily.     progesterone (PROMETRIUM) 100 MG capsule Take 200 mg by mouth daily.     No current facility-administered medications for this visit.    REVIEW OF SYSTEMS:   Constitutional: Denies fevers, chills or abnormal night sweats Eyes: Denies blurriness of vision, double vision or watery eyes Ears, nose, mouth, throat, and face: Denies mucositis or sore throat Respiratory: Denies cough, dyspnea or wheezes Cardiovascular: Denies palpitation, chest discomfort or lower extremity swelling Gastrointestinal:  Denies nausea, heartburn or change in bowel habits Skin: Denies abnormal skin rashes Lymphatics: Denies new lymphadenopathy or easy bruising Neurological:Denies numbness, tingling or new  weaknesses Behavioral/Psych: Mood is stable, no new changes  All other systems were reviewed with the patient and are negative.  PHYSICAL EXAMINATION: ECOG PERFORMANCE STATUS: 0 - Asymptomatic  Vitals:   12/29/23 1256  BP: (!) 152/79  Pulse: 99  Resp: 18  Temp: 99.8 F (37.7 C)  SpO2: 95%   Filed Weights   12/29/23 1256  Weight: 275 lb 3.2 oz (  124.8 kg)    GENERAL:alert, no distress and comfortable  LABORATORY DATA:  I have reviewed the data as listed She had thrombophilia workup on September 30 on November 16, 2016, results were reviewed

## 2023-12-29 NOTE — Assessment & Plan Note (Addendum)
 The patient developed right lower extremity DVT and PE in the setting of a right ankle injury and concurrent use of birth control pill at the same time.  Her birth control pill was discontinued and she was anticoagulated appropriately This was a provoked incidence She had been on birth control pills for almost 20 years at that point without thrombotic event until 2018 She had extensive thrombophilia workup at the time; test result showed no evidence of lupus anticoagulant, Antithrombin III  deficiency, prothrombin gene mutation, factor V Leiden mutation or protein C deficiency.  Protein S activity was borderline low likely due to diagnosis of blood clot at the time and cannot be interpreted accurately  Overall, I do not see contraindication for her to proceed with hormone replacement therapy with estrogen containing regimen.  However, I do recommend the patient to consider topical estrogen products whether in the form of patches or topical cream if possible.  Topical form of estrogen typically bypassed first-pass metabolism and the doses typically much lower in comparison with oral estrogen replacement products  We also discussed consideration for either hysterectomy or IUD placement as preferred risk profile for her rather than Prometrium.  The risk of DVT with IUD is generally much lower than oral progesterone only pills  We discussed general risk factors for recurrent DVT and PE such as surgery, prolonged immobilization, dehydration, certain hormone replacement products, smoking, cancer and others  There is no need for blood work today.  She does not need long-term follow-up.  Her gynecologist will be prescribing her hormone replacement therapy.  I will see her in the future for perioperative anticoagulation management if needed.
